# Patient Record
Sex: Female | Born: 1935 | Race: White | Hispanic: No | Smoking: Never smoker
Health system: Southern US, Community
[De-identification: ages and names within clinical notes are randomized; demographics above are authoritative.]

---

## 2001-11-28 ENCOUNTER — Encounter (HOSPITAL_BASED_OUTPATIENT_CLINIC_OR_DEPARTMENT_OTHER): Admission: RE | Admit: 2001-11-28 | Discharge: 2002-02-26 | Payer: Self-pay | Admitting: Internal Medicine

## 2001-12-03 ENCOUNTER — Ambulatory Visit (HOSPITAL_COMMUNITY): Admission: RE | Admit: 2001-12-03 | Discharge: 2001-12-03 | Payer: Self-pay | Admitting: Internal Medicine

## 2001-12-03 ENCOUNTER — Encounter (HOSPITAL_BASED_OUTPATIENT_CLINIC_OR_DEPARTMENT_OTHER): Payer: Self-pay | Admitting: Internal Medicine

## 2003-09-15 ENCOUNTER — Other Ambulatory Visit: Payer: Self-pay

## 2004-02-07 ENCOUNTER — Ambulatory Visit: Payer: Self-pay | Admitting: Internal Medicine

## 2004-03-09 ENCOUNTER — Ambulatory Visit: Payer: Self-pay | Admitting: Internal Medicine

## 2004-04-08 ENCOUNTER — Ambulatory Visit: Payer: Self-pay | Admitting: Internal Medicine

## 2004-05-09 ENCOUNTER — Ambulatory Visit: Payer: Self-pay | Admitting: Internal Medicine

## 2004-06-09 ENCOUNTER — Ambulatory Visit: Payer: Self-pay | Admitting: Internal Medicine

## 2004-07-07 ENCOUNTER — Ambulatory Visit: Payer: Self-pay | Admitting: Internal Medicine

## 2004-08-09 ENCOUNTER — Ambulatory Visit: Payer: Self-pay | Admitting: Internal Medicine

## 2004-08-30 ENCOUNTER — Ambulatory Visit: Payer: Self-pay | Admitting: Internal Medicine

## 2004-09-20 ENCOUNTER — Ambulatory Visit: Payer: Self-pay | Admitting: Internal Medicine

## 2004-09-30 ENCOUNTER — Ambulatory Visit: Payer: Self-pay | Admitting: Internal Medicine

## 2004-10-07 ENCOUNTER — Ambulatory Visit: Payer: Self-pay | Admitting: Internal Medicine

## 2004-10-14 ENCOUNTER — Ambulatory Visit: Payer: Self-pay | Admitting: Internal Medicine

## 2004-11-06 ENCOUNTER — Ambulatory Visit: Payer: Self-pay | Admitting: Internal Medicine

## 2004-12-07 ENCOUNTER — Ambulatory Visit: Payer: Self-pay | Admitting: Internal Medicine

## 2005-01-07 ENCOUNTER — Ambulatory Visit: Payer: Self-pay | Admitting: Internal Medicine

## 2005-02-02 ENCOUNTER — Ambulatory Visit: Payer: Self-pay | Admitting: Family Medicine

## 2005-02-06 ENCOUNTER — Ambulatory Visit: Payer: Self-pay | Admitting: Internal Medicine

## 2005-02-06 DIAGNOSIS — Z8679 Personal history of other diseases of the circulatory system: Secondary | ICD-10-CM | POA: Insufficient documentation

## 2005-02-17 ENCOUNTER — Ambulatory Visit: Payer: Self-pay

## 2005-02-28 ENCOUNTER — Encounter: Admission: RE | Admit: 2005-02-28 | Discharge: 2005-02-28 | Payer: Self-pay | Admitting: Neurology

## 2005-03-09 ENCOUNTER — Ambulatory Visit: Payer: Self-pay | Admitting: Internal Medicine

## 2005-03-21 ENCOUNTER — Ambulatory Visit: Payer: Self-pay | Admitting: Family Medicine

## 2005-04-18 ENCOUNTER — Ambulatory Visit: Payer: Self-pay | Admitting: Family Medicine

## 2005-04-19 ENCOUNTER — Ambulatory Visit: Payer: Self-pay | Admitting: Internal Medicine

## 2005-04-25 ENCOUNTER — Ambulatory Visit: Payer: Self-pay | Admitting: Internal Medicine

## 2005-05-09 ENCOUNTER — Ambulatory Visit: Payer: Self-pay | Admitting: Internal Medicine

## 2005-06-09 ENCOUNTER — Ambulatory Visit: Payer: Self-pay | Admitting: Internal Medicine

## 2005-06-20 ENCOUNTER — Ambulatory Visit: Payer: Self-pay | Admitting: Family Medicine

## 2005-06-30 ENCOUNTER — Emergency Department (HOSPITAL_COMMUNITY): Admission: EM | Admit: 2005-06-30 | Discharge: 2005-06-30 | Payer: Self-pay | Admitting: Emergency Medicine

## 2005-07-05 ENCOUNTER — Ambulatory Visit: Payer: Self-pay | Admitting: Family Medicine

## 2005-07-07 ENCOUNTER — Ambulatory Visit: Payer: Self-pay | Admitting: Internal Medicine

## 2005-07-25 ENCOUNTER — Ambulatory Visit: Payer: Self-pay | Admitting: Family Medicine

## 2005-08-01 ENCOUNTER — Ambulatory Visit: Payer: Self-pay | Admitting: Family Medicine

## 2005-08-07 ENCOUNTER — Ambulatory Visit: Payer: Self-pay | Admitting: Internal Medicine

## 2005-09-06 ENCOUNTER — Ambulatory Visit: Payer: Self-pay | Admitting: Internal Medicine

## 2005-10-07 ENCOUNTER — Ambulatory Visit: Payer: Self-pay | Admitting: Internal Medicine

## 2005-10-07 DIAGNOSIS — F329 Major depressive disorder, single episode, unspecified: Secondary | ICD-10-CM

## 2005-10-07 DIAGNOSIS — F411 Generalized anxiety disorder: Secondary | ICD-10-CM | POA: Insufficient documentation

## 2005-10-24 ENCOUNTER — Ambulatory Visit: Payer: Self-pay | Admitting: Family Medicine

## 2005-10-24 DIAGNOSIS — I10 Essential (primary) hypertension: Secondary | ICD-10-CM | POA: Insufficient documentation

## 2005-10-28 ENCOUNTER — Ambulatory Visit: Payer: Self-pay | Admitting: Family Medicine

## 2005-11-06 ENCOUNTER — Ambulatory Visit: Payer: Self-pay | Admitting: Internal Medicine

## 2005-11-21 ENCOUNTER — Ambulatory Visit: Payer: Self-pay | Admitting: Family Medicine

## 2005-11-29 ENCOUNTER — Ambulatory Visit: Payer: Self-pay | Admitting: Family Medicine

## 2005-12-12 ENCOUNTER — Ambulatory Visit: Payer: Self-pay | Admitting: Internal Medicine

## 2005-12-12 ENCOUNTER — Ambulatory Visit: Payer: Self-pay | Admitting: Family Medicine

## 2005-12-19 ENCOUNTER — Ambulatory Visit: Payer: Self-pay | Admitting: Family Medicine

## 2006-01-07 ENCOUNTER — Ambulatory Visit: Payer: Self-pay | Admitting: Internal Medicine

## 2006-01-11 ENCOUNTER — Ambulatory Visit: Payer: Self-pay | Admitting: Family Medicine

## 2006-02-06 ENCOUNTER — Ambulatory Visit: Payer: Self-pay | Admitting: Internal Medicine

## 2006-03-09 ENCOUNTER — Ambulatory Visit: Payer: Self-pay | Admitting: Internal Medicine

## 2006-04-08 ENCOUNTER — Ambulatory Visit: Payer: Self-pay | Admitting: Internal Medicine

## 2006-04-11 ENCOUNTER — Ambulatory Visit: Payer: Self-pay | Admitting: Family Medicine

## 2006-05-11 ENCOUNTER — Ambulatory Visit: Payer: Self-pay | Admitting: Internal Medicine

## 2006-05-30 ENCOUNTER — Ambulatory Visit: Payer: Self-pay | Admitting: Family Medicine

## 2006-06-09 ENCOUNTER — Ambulatory Visit: Payer: Self-pay | Admitting: Internal Medicine

## 2006-07-08 ENCOUNTER — Ambulatory Visit: Payer: Self-pay | Admitting: Internal Medicine

## 2006-08-08 ENCOUNTER — Ambulatory Visit: Payer: Self-pay | Admitting: Internal Medicine

## 2006-08-15 DIAGNOSIS — M810 Age-related osteoporosis without current pathological fracture: Secondary | ICD-10-CM | POA: Insufficient documentation

## 2006-08-15 DIAGNOSIS — E039 Hypothyroidism, unspecified: Secondary | ICD-10-CM | POA: Insufficient documentation

## 2006-09-07 ENCOUNTER — Ambulatory Visit: Payer: Self-pay | Admitting: Internal Medicine

## 2006-09-27 ENCOUNTER — Ambulatory Visit: Payer: Self-pay | Admitting: Family Medicine

## 2006-09-27 ENCOUNTER — Telehealth: Payer: Self-pay | Admitting: Family Medicine

## 2006-10-08 ENCOUNTER — Ambulatory Visit: Payer: Self-pay | Admitting: Internal Medicine

## 2006-10-10 ENCOUNTER — Ambulatory Visit: Payer: Self-pay | Admitting: Family Medicine

## 2006-10-10 DIAGNOSIS — E7800 Pure hypercholesterolemia, unspecified: Secondary | ICD-10-CM

## 2006-10-12 ENCOUNTER — Encounter (INDEPENDENT_AMBULATORY_CARE_PROVIDER_SITE_OTHER): Payer: Self-pay | Admitting: *Deleted

## 2006-10-12 ENCOUNTER — Ambulatory Visit: Payer: Self-pay | Admitting: Family Medicine

## 2006-10-12 DIAGNOSIS — D485 Neoplasm of uncertain behavior of skin: Secondary | ICD-10-CM

## 2006-10-12 LAB — CONVERTED CEMR LAB
ALT: 17 units/L (ref 0–40)
Albumin: 3.6 g/dL (ref 3.5–5.2)
Basophils Absolute: 0.4 10*3/uL — ABNORMAL HIGH (ref 0.0–0.1)
Basophils Relative: 2.8 % — ABNORMAL HIGH (ref 0.0–1.0)
Calcium: 9.2 mg/dL (ref 8.4–10.5)
Eosinophils Absolute: 1.2 10*3/uL — ABNORMAL HIGH (ref 0.0–0.6)
Eosinophils Relative: 10 % — ABNORMAL HIGH (ref 0.0–5.0)
Free T4: 0.9 ng/dL (ref 0.6–1.6)
Glucose, Bld: 88 mg/dL (ref 70–99)
HDL: 36.2 mg/dL — ABNORMAL LOW (ref >39.0)
Hemoglobin: 13.2 g/dL (ref 12.0–15.0)
Monocytes Absolute: 0.2 10*3/uL (ref 0.2–0.7)
Monocytes Relative: 1.7 % — ABNORMAL LOW (ref 3.0–11.0)
Neutrophils Relative %: 76 % (ref 43.0–77.0)
Platelets: 238 10*3/uL (ref 150–400)
RBC: 6.21 M/uL — ABNORMAL HIGH (ref 3.87–5.11)
RDW: 22.7 % — ABNORMAL HIGH (ref 11.5–14.6)
Sodium: 135 meq/L (ref 135–145)
TSH: 11.7 microintl units/mL — ABNORMAL HIGH (ref 0.35–5.50)
Total Bilirubin: 1 mg/dL (ref 0.3–1.2)
VLDL: 14 mg/dL (ref 0–40)

## 2006-10-19 ENCOUNTER — Ambulatory Visit: Payer: Self-pay | Admitting: Family Medicine

## 2006-10-19 DIAGNOSIS — D044 Carcinoma in situ of skin of scalp and neck: Secondary | ICD-10-CM

## 2006-10-26 ENCOUNTER — Ambulatory Visit: Payer: Self-pay | Admitting: Family Medicine

## 2006-10-27 ENCOUNTER — Encounter (INDEPENDENT_AMBULATORY_CARE_PROVIDER_SITE_OTHER): Payer: Self-pay | Admitting: *Deleted

## 2006-11-07 ENCOUNTER — Ambulatory Visit: Payer: Self-pay | Admitting: Internal Medicine

## 2006-11-16 ENCOUNTER — Ambulatory Visit: Payer: Self-pay | Admitting: Internal Medicine

## 2006-12-08 ENCOUNTER — Ambulatory Visit: Payer: Self-pay | Admitting: Internal Medicine

## 2007-01-08 ENCOUNTER — Ambulatory Visit: Payer: Self-pay | Admitting: Internal Medicine

## 2007-01-23 ENCOUNTER — Ambulatory Visit: Payer: Self-pay | Admitting: Family Medicine

## 2007-01-23 LAB — CONVERTED CEMR LAB
Free T4: 0.9 ng/dL (ref 0.6–1.6)
TSH: 6.93 microintl units/mL — ABNORMAL HIGH (ref 0.35–5.50)

## 2007-01-26 ENCOUNTER — Ambulatory Visit: Payer: Self-pay | Admitting: Family Medicine

## 2007-02-07 ENCOUNTER — Encounter: Payer: Self-pay | Admitting: Family Medicine

## 2007-02-07 ENCOUNTER — Ambulatory Visit: Payer: Self-pay | Admitting: Internal Medicine

## 2007-02-07 ENCOUNTER — Emergency Department (HOSPITAL_COMMUNITY): Admission: EM | Admit: 2007-02-07 | Discharge: 2007-02-08 | Payer: Self-pay | Admitting: Emergency Medicine

## 2007-03-10 ENCOUNTER — Ambulatory Visit: Payer: Self-pay | Admitting: Internal Medicine

## 2007-03-18 ENCOUNTER — Telehealth: Payer: Self-pay | Admitting: Internal Medicine

## 2007-04-09 ENCOUNTER — Ambulatory Visit: Payer: Self-pay | Admitting: Internal Medicine

## 2007-04-10 ENCOUNTER — Telehealth: Payer: Self-pay | Admitting: Family Medicine

## 2007-05-01 ENCOUNTER — Telehealth (INDEPENDENT_AMBULATORY_CARE_PROVIDER_SITE_OTHER): Payer: Self-pay | Admitting: *Deleted

## 2007-05-01 ENCOUNTER — Ambulatory Visit: Payer: Self-pay | Admitting: Family Medicine

## 2007-05-10 ENCOUNTER — Ambulatory Visit: Payer: Self-pay | Admitting: Internal Medicine

## 2007-06-10 ENCOUNTER — Ambulatory Visit: Payer: Self-pay | Admitting: Internal Medicine

## 2007-06-21 ENCOUNTER — Ambulatory Visit: Payer: Self-pay | Admitting: Family Medicine

## 2007-06-21 ENCOUNTER — Encounter: Payer: Self-pay | Admitting: Family Medicine

## 2007-06-26 ENCOUNTER — Encounter (INDEPENDENT_AMBULATORY_CARE_PROVIDER_SITE_OTHER): Payer: Self-pay | Admitting: *Deleted

## 2007-06-28 ENCOUNTER — Ambulatory Visit: Payer: Self-pay | Admitting: Internal Medicine

## 2007-07-08 ENCOUNTER — Ambulatory Visit: Payer: Self-pay | Admitting: Internal Medicine

## 2007-07-10 ENCOUNTER — Telehealth: Payer: Self-pay | Admitting: Family Medicine

## 2007-07-26 ENCOUNTER — Ambulatory Visit: Payer: Self-pay | Admitting: Family Medicine

## 2007-07-26 LAB — CONVERTED CEMR LAB
Free T4: 1.2 ng/dL (ref 0.6–1.6)
TSH: 3.46 microintl units/mL (ref 0.35–5.50)

## 2007-07-30 ENCOUNTER — Ambulatory Visit: Payer: Self-pay | Admitting: Family Medicine

## 2007-08-07 ENCOUNTER — Ambulatory Visit: Payer: Self-pay | Admitting: Internal Medicine

## 2007-08-08 ENCOUNTER — Ambulatory Visit: Payer: Self-pay | Admitting: Internal Medicine

## 2007-08-09 ENCOUNTER — Encounter (INDEPENDENT_AMBULATORY_CARE_PROVIDER_SITE_OTHER): Payer: Self-pay | Admitting: *Deleted

## 2007-09-07 ENCOUNTER — Ambulatory Visit: Payer: Self-pay | Admitting: Internal Medicine

## 2007-09-11 ENCOUNTER — Encounter: Payer: Self-pay | Admitting: Family Medicine

## 2007-10-08 ENCOUNTER — Ambulatory Visit: Payer: Self-pay | Admitting: Internal Medicine

## 2007-11-07 ENCOUNTER — Ambulatory Visit: Payer: Self-pay | Admitting: Internal Medicine

## 2007-11-24 ENCOUNTER — Observation Stay (HOSPITAL_COMMUNITY): Admission: EM | Admit: 2007-11-24 | Discharge: 2007-11-25 | Payer: Self-pay | Admitting: Emergency Medicine

## 2007-11-24 ENCOUNTER — Ambulatory Visit: Payer: Self-pay | Admitting: Internal Medicine

## 2007-11-24 ENCOUNTER — Telehealth: Payer: Self-pay | Admitting: Family Medicine

## 2007-11-25 ENCOUNTER — Encounter: Payer: Self-pay | Admitting: Family Medicine

## 2007-11-27 ENCOUNTER — Encounter: Payer: Self-pay | Admitting: Family Medicine

## 2007-12-04 ENCOUNTER — Ambulatory Visit: Payer: Self-pay | Admitting: Family Medicine

## 2007-12-08 ENCOUNTER — Ambulatory Visit: Payer: Self-pay | Admitting: Internal Medicine

## 2008-01-08 ENCOUNTER — Ambulatory Visit: Payer: Self-pay | Admitting: Internal Medicine

## 2008-01-24 ENCOUNTER — Ambulatory Visit: Payer: Self-pay | Admitting: Family Medicine

## 2008-01-24 LAB — CONVERTED CEMR LAB
AST: 24 units/L (ref 0–37)
Alkaline Phosphatase: 59 units/L (ref 39–117)
BUN: 21 mg/dL (ref 6–23)
Basophils Absolute: 0 10*3/uL (ref 0.0–0.1)
Bilirubin, Direct: 0.2 mg/dL (ref 0.0–0.3)
CO2: 26 meq/L (ref 19–32)
Calcium: 9.4 mg/dL (ref 8.4–10.5)
Cholesterol: 96 mg/dL (ref 0–200)
Free T4: 0.9 ng/dL (ref 0.6–1.6)
GFR calc Af Amer: 37 mL/min
GFR calc non Af Amer: 31 mL/min
HCT: 40.6 % (ref 36.0–46.0)
HDL: 32.5 mg/dL — ABNORMAL LOW (ref >39.0)
Hemoglobin: 12.8 g/dL (ref 12.0–15.0)
LDL Cholesterol: 48 mg/dL (ref 0–99)
Lymphocytes Relative: 14.1 % (ref 12.0–46.0)
MCHC: 31.5 g/dL (ref 30.0–36.0)
Monocytes Absolute: 0.6 10*3/uL (ref 0.1–1.0)
Potassium: 4.1 meq/L (ref 3.5–5.1)
Sodium: 136 meq/L (ref 135–145)
Total Bilirubin: 1.1 mg/dL (ref 0.3–1.2)
Total Protein: 7.3 g/dL (ref 6.0–8.3)
VLDL: 15 mg/dL (ref 0–40)
WBC: 9.3 10*3/uL (ref 4.5–10.5)

## 2008-01-29 ENCOUNTER — Ambulatory Visit: Payer: Self-pay | Admitting: Family Medicine

## 2008-02-07 ENCOUNTER — Ambulatory Visit: Payer: Self-pay | Admitting: Internal Medicine

## 2008-02-15 ENCOUNTER — Telehealth: Payer: Self-pay | Admitting: Family Medicine

## 2008-02-19 ENCOUNTER — Telehealth: Payer: Self-pay | Admitting: Family Medicine

## 2008-03-04 ENCOUNTER — Ambulatory Visit: Payer: Self-pay | Admitting: Family Medicine

## 2008-03-04 LAB — CONVERTED CEMR LAB
BUN: 25 mg/dL — ABNORMAL HIGH (ref 6–23)
Basophils Absolute: 0 10*3/uL (ref 0.0–0.1)
Basophils Relative: 0.1 % (ref 0.0–3.0)
Calcium: 9.1 mg/dL (ref 8.4–10.5)
Creatinine, Ser: 2.9 mg/dL — ABNORMAL HIGH (ref 0.4–1.2)
Eosinophils Relative: 8.9 % — ABNORMAL HIGH (ref 0.0–5.0)
GFR calc Af Amer: 20 mL/min
Iron: 15 ug/dL — ABNORMAL LOW (ref 42–145)
Lymphocytes Relative: 19.7 % (ref 12.0–46.0)
MCHC: 31 g/dL (ref 30.0–36.0)
Monocytes Relative: 4.9 % (ref 3.0–12.0)
Neutrophils Relative %: 66.4 % (ref 43.0–77.0)
Platelets: 178 10*3/uL (ref 150–400)
RDW: 24.8 % — ABNORMAL HIGH (ref 11.5–14.6)

## 2008-03-09 ENCOUNTER — Ambulatory Visit: Payer: Self-pay | Admitting: Internal Medicine

## 2008-03-13 ENCOUNTER — Ambulatory Visit: Payer: Self-pay | Admitting: Family Medicine

## 2008-03-13 DIAGNOSIS — J984 Other disorders of lung: Secondary | ICD-10-CM

## 2008-03-18 ENCOUNTER — Ambulatory Visit: Payer: Self-pay | Admitting: Internal Medicine

## 2008-03-19 ENCOUNTER — Ambulatory Visit: Payer: Self-pay | Admitting: Family Medicine

## 2008-04-02 ENCOUNTER — Encounter: Payer: Self-pay | Admitting: Family Medicine

## 2008-04-02 ENCOUNTER — Telehealth: Payer: Self-pay | Admitting: Family Medicine

## 2008-04-04 ENCOUNTER — Observation Stay: Payer: Self-pay | Admitting: Nephrology

## 2008-04-08 ENCOUNTER — Ambulatory Visit: Payer: Self-pay | Admitting: Internal Medicine

## 2008-05-09 ENCOUNTER — Ambulatory Visit: Payer: Self-pay | Admitting: Internal Medicine

## 2008-05-26 ENCOUNTER — Encounter: Payer: Self-pay | Admitting: Family Medicine

## 2008-06-02 ENCOUNTER — Telehealth: Payer: Self-pay | Admitting: Family Medicine

## 2008-06-02 ENCOUNTER — Encounter: Payer: Self-pay | Admitting: Family Medicine

## 2008-06-10 ENCOUNTER — Ambulatory Visit: Payer: Self-pay | Admitting: Internal Medicine

## 2008-06-12 ENCOUNTER — Ambulatory Visit: Payer: Self-pay | Admitting: Family Medicine

## 2008-06-16 LAB — CONVERTED CEMR LAB
Albumin: 3.3 g/dL — ABNORMAL LOW (ref 3.5–5.2)
Basophils Absolute: 0 10*3/uL (ref 0.0–0.1)
CO2: 0 meq/L — CL (ref 19–32)
Eosinophils Absolute: 0.3 10*3/uL (ref 0.0–0.7)
Eosinophils Relative: 2.1 % (ref 0.0–5.0)
Glucose, Bld: 101 mg/dL — ABNORMAL HIGH (ref 70–99)
HCT: 40.7 % (ref 36.0–46.0)
Hemoglobin: 12.5 g/dL (ref 12.0–15.0)
Neutro Abs: 11.8 10*3/uL — ABNORMAL HIGH (ref 1.4–7.7)
Platelets: 157 10*3/uL (ref 150–400)
RDW: 21.9 % — ABNORMAL HIGH (ref 11.5–14.6)
Sodium: 137 meq/L (ref 135–145)

## 2008-06-23 ENCOUNTER — Ambulatory Visit: Payer: Self-pay | Admitting: Family Medicine

## 2008-06-24 ENCOUNTER — Ambulatory Visit: Payer: Self-pay | Admitting: Family Medicine

## 2008-06-24 ENCOUNTER — Encounter: Payer: Self-pay | Admitting: Family Medicine

## 2008-06-26 ENCOUNTER — Encounter (INDEPENDENT_AMBULATORY_CARE_PROVIDER_SITE_OTHER): Payer: Self-pay | Admitting: *Deleted

## 2008-06-30 ENCOUNTER — Ambulatory Visit: Payer: Self-pay | Admitting: Family Medicine

## 2008-07-07 ENCOUNTER — Ambulatory Visit: Payer: Self-pay | Admitting: Internal Medicine

## 2008-07-07 ENCOUNTER — Encounter: Payer: Self-pay | Admitting: Family Medicine

## 2008-07-14 ENCOUNTER — Ambulatory Visit: Payer: Self-pay | Admitting: Family Medicine

## 2008-08-04 ENCOUNTER — Encounter: Payer: Self-pay | Admitting: Family Medicine

## 2008-08-07 ENCOUNTER — Ambulatory Visit: Payer: Self-pay | Admitting: Internal Medicine

## 2008-09-06 ENCOUNTER — Ambulatory Visit: Payer: Self-pay | Admitting: Internal Medicine

## 2008-09-09 ENCOUNTER — Ambulatory Visit: Payer: Self-pay | Admitting: Internal Medicine

## 2008-09-29 ENCOUNTER — Encounter: Payer: Self-pay | Admitting: Family Medicine

## 2008-10-07 ENCOUNTER — Ambulatory Visit: Payer: Self-pay | Admitting: Internal Medicine

## 2008-10-21 ENCOUNTER — Ambulatory Visit: Payer: Self-pay | Admitting: Family Medicine

## 2008-10-21 LAB — CONVERTED CEMR LAB
AST: 26 units/L (ref 0–37)
Albumin: 3.6 g/dL (ref 3.5–5.2)
BUN: 20 mg/dL (ref 6–23)
Basophils Relative: 0.2 % (ref 0.0–3.0)
Bilirubin, Direct: 0.1 mg/dL (ref 0.0–0.3)
CO2: 28 meq/L (ref 19–32)
Chloride: 105 meq/L (ref 96–112)
Cholesterol: 110 mg/dL (ref 0–200)
HCT: 44.3 % (ref 36.0–46.0)
HDL: 29 mg/dL — ABNORMAL LOW (ref >39.00)
Hemoglobin: 13.4 g/dL (ref 12.0–15.0)
Lymphs Abs: 1.6 10*3/uL (ref 0.7–4.0)
MCHC: 30.2 g/dL (ref 30.0–36.0)
Neutro Abs: 6 10*3/uL (ref 1.4–7.7)
Potassium: 4 meq/L (ref 3.5–5.1)
RBC: 5.98 M/uL — ABNORMAL HIGH (ref 3.87–5.11)
Sodium: 139 meq/L (ref 135–145)
TSH: 2.58 microintl units/mL (ref 0.35–5.50)
Total CHOL/HDL Ratio: 4
Total Protein: 7.2 g/dL (ref 6.0–8.3)
Triglycerides: 88 mg/dL (ref 0.0–149.0)

## 2008-10-23 LAB — CONVERTED CEMR LAB: Vit D, 25-Hydroxy: 34 ng/mL (ref 30–89)

## 2008-10-27 ENCOUNTER — Telehealth: Payer: Self-pay | Admitting: Family Medicine

## 2008-10-29 ENCOUNTER — Ambulatory Visit: Payer: Self-pay | Admitting: Family Medicine

## 2008-11-06 ENCOUNTER — Ambulatory Visit: Payer: Self-pay | Admitting: Internal Medicine

## 2008-11-18 ENCOUNTER — Encounter: Payer: Self-pay | Admitting: Family Medicine

## 2008-12-07 ENCOUNTER — Ambulatory Visit: Payer: Self-pay | Admitting: Internal Medicine

## 2008-12-22 ENCOUNTER — Encounter: Payer: Self-pay | Admitting: Family Medicine

## 2009-01-07 ENCOUNTER — Ambulatory Visit: Payer: Self-pay | Admitting: Internal Medicine

## 2009-02-06 ENCOUNTER — Ambulatory Visit: Payer: Self-pay | Admitting: Internal Medicine

## 2009-03-09 ENCOUNTER — Ambulatory Visit: Payer: Self-pay | Admitting: Internal Medicine

## 2009-03-16 ENCOUNTER — Telehealth: Payer: Self-pay | Admitting: Family Medicine

## 2009-03-16 ENCOUNTER — Encounter: Payer: Self-pay | Admitting: Family Medicine

## 2009-04-08 ENCOUNTER — Ambulatory Visit: Payer: Self-pay | Admitting: Internal Medicine

## 2009-04-21 ENCOUNTER — Ambulatory Visit: Payer: Self-pay | Admitting: Internal Medicine

## 2009-05-09 ENCOUNTER — Ambulatory Visit: Payer: Self-pay | Admitting: Internal Medicine

## 2009-05-12 ENCOUNTER — Ambulatory Visit: Payer: Self-pay | Admitting: Family Medicine

## 2009-06-01 ENCOUNTER — Telehealth (INDEPENDENT_AMBULATORY_CARE_PROVIDER_SITE_OTHER): Payer: Self-pay | Admitting: *Deleted

## 2009-06-08 ENCOUNTER — Encounter: Payer: Self-pay | Admitting: Family Medicine

## 2009-06-09 ENCOUNTER — Ambulatory Visit: Payer: Self-pay | Admitting: Internal Medicine

## 2009-07-07 ENCOUNTER — Encounter: Payer: Self-pay | Admitting: Family Medicine

## 2009-07-07 ENCOUNTER — Ambulatory Visit: Payer: Self-pay | Admitting: Family Medicine

## 2009-07-08 ENCOUNTER — Ambulatory Visit: Payer: Self-pay | Admitting: Internal Medicine

## 2009-07-13 ENCOUNTER — Encounter (INDEPENDENT_AMBULATORY_CARE_PROVIDER_SITE_OTHER): Payer: Self-pay | Admitting: *Deleted

## 2009-08-07 ENCOUNTER — Ambulatory Visit: Payer: Self-pay | Admitting: Internal Medicine

## 2009-08-31 ENCOUNTER — Telehealth: Payer: Self-pay | Admitting: Family Medicine

## 2009-09-01 ENCOUNTER — Encounter: Payer: Self-pay | Admitting: Family Medicine

## 2009-09-06 ENCOUNTER — Ambulatory Visit: Payer: Self-pay | Admitting: Internal Medicine

## 2009-10-07 ENCOUNTER — Ambulatory Visit: Payer: Self-pay | Admitting: Internal Medicine

## 2009-10-26 ENCOUNTER — Telehealth: Payer: Self-pay | Admitting: Family Medicine

## 2009-11-06 ENCOUNTER — Ambulatory Visit: Payer: Self-pay | Admitting: Internal Medicine

## 2009-11-13 ENCOUNTER — Ambulatory Visit: Payer: Self-pay | Admitting: Family Medicine

## 2009-11-24 ENCOUNTER — Encounter: Payer: Self-pay | Admitting: Family Medicine

## 2009-12-07 ENCOUNTER — Ambulatory Visit: Payer: Self-pay | Admitting: Internal Medicine

## 2009-12-09 ENCOUNTER — Encounter (INDEPENDENT_AMBULATORY_CARE_PROVIDER_SITE_OTHER): Payer: Self-pay | Admitting: *Deleted

## 2010-01-07 ENCOUNTER — Ambulatory Visit: Payer: Self-pay | Admitting: Internal Medicine

## 2010-02-06 ENCOUNTER — Ambulatory Visit: Payer: Self-pay | Admitting: Internal Medicine

## 2010-03-09 ENCOUNTER — Ambulatory Visit: Payer: Self-pay | Admitting: Internal Medicine

## 2010-03-19 ENCOUNTER — Encounter: Payer: Self-pay | Admitting: Family Medicine

## 2010-04-08 ENCOUNTER — Ambulatory Visit: Payer: Self-pay | Admitting: Internal Medicine

## 2010-05-09 ENCOUNTER — Ambulatory Visit: Payer: Self-pay | Admitting: Internal Medicine

## 2010-05-11 ENCOUNTER — Telehealth: Payer: Self-pay | Admitting: Family Medicine

## 2010-05-19 ENCOUNTER — Ambulatory Visit: Admit: 2010-05-19 | Payer: Self-pay | Admitting: Family Medicine

## 2010-05-27 ENCOUNTER — Other Ambulatory Visit: Payer: Self-pay | Admitting: Family Medicine

## 2010-05-27 ENCOUNTER — Ambulatory Visit
Admission: RE | Admit: 2010-05-27 | Discharge: 2010-05-27 | Payer: Self-pay | Source: Home / Self Care | Attending: Family Medicine | Admitting: Family Medicine

## 2010-05-28 LAB — BASIC METABOLIC PANEL
BUN: 29 mg/dL — ABNORMAL HIGH (ref 6–23)
CO2: 26 mEq/L (ref 19–32)
Calcium: 8.9 mg/dL (ref 8.4–10.5)
Chloride: 99 mEq/L (ref 96–112)
Creatinine, Ser: 1.4 mg/dL — ABNORMAL HIGH (ref 0.4–1.2)
GFR: 39.19 mL/min — ABNORMAL LOW (ref >60.00)
Glucose, Bld: 85 mg/dL (ref 70–99)
Potassium: 4.7 mEq/L (ref 3.5–5.1)
Sodium: 134 mEq/L — ABNORMAL LOW (ref 135–145)

## 2010-05-28 LAB — LDL CHOLESTEROL, DIRECT: Direct LDL: 66.9 mg/dL

## 2010-05-28 LAB — LIPID PANEL
Cholesterol: 107 mg/dL (ref 0–200)
HDL: 26.8 mg/dL — ABNORMAL LOW (ref >39.00)
Total CHOL/HDL Ratio: 4
Triglycerides: 205 mg/dL — ABNORMAL HIGH (ref 0.0–149.0)
VLDL: 41 mg/dL — ABNORMAL HIGH (ref 0.0–40.0)

## 2010-05-28 LAB — TSH: TSH: 1.11 u[IU]/mL (ref 0.35–5.50)

## 2010-06-06 LAB — CONVERTED CEMR LAB
BUN: 16 mg/dL (ref 6–23)
CO2: 23 meq/L (ref 19–32)
Calcium: 8.9 mg/dL (ref 8.4–10.5)
GFR calc non Af Amer: 46 mL/min
Potassium: 4.7 meq/L (ref 3.5–5.1)

## 2010-06-09 ENCOUNTER — Ambulatory Visit: Payer: Self-pay | Admitting: Internal Medicine

## 2010-06-10 NOTE — Letter (Signed)
Summary: [facility] / F/U CHRONIC KIDNEY DISEASE STATE  [facility] / F/U CHRONIC KIDNEY DISEASE STATE III / DR. Rexene Edison LATEEF   Imported By: Carin Primrose 09/01/2009 14:40:41  _____________________________________________________________________  External Attachment:    Type:   Image     Comment:   External Document

## 2010-06-10 NOTE — Progress Notes (Signed)
Summary: Rx Sertraline  Phone Note Refill Request Call back at [phone] Message [facility] on August 31, 2009 9:31 AM  Refills Requested: Medication #1:  SERTRALINE HCL 25 MG TABS 1 1/2 once daily/ by mouth   Last Refilled: 05/31/2009 Received faxed refill request please advise.   Method Requested: Telephone to Pharmacy Initial call taken by: Linde Gillis CMA Duncan Dull),  August 31, 2009 9:32 AM    Prescriptions: SERTRALINE HCL 25 MG TABS (SERTRALINE HCL) 1 1/2 once daily/ by mouth  #135 x 4   Entered and Authorized by:   Shaune Leeks MD   Signed by:   Shaune Leeks MD on 08/31/2009   Method used:   [facility]ronically to        [facility] [phone]* (retail)       [address] [place], [place]  [address]       Ph: [phone] or [phone]       Fax: [phone]   RxID:   [id number]

## 2010-06-10 NOTE — Letter (Signed)
Summary: Donna [name] letter  [place] at [facility]  [address] [place], [place] [address]   Phone: [phone]  Fax: [phone]       12/09/2009 MRN: [id number]  Donna [name] [address] [place], [place]  [address]  Dear Ms. Donna [name] [place], and [facility] announce the retirement of Donna [name], M.D., from full-time practice at the [facility] office effective November 05, 2009 and his plans of returning part-time.  It is important to Dr. Hetty [name] and to our practice that you understand that [facility] has seven physicians in our office for your health care needs.  We will continue to offer the same exceptional care that you have today.    Dr. Hetty [name] has spoken to many of you about his plans for retirement and returning part-time in the fall.   We will continue to work with you through the transition to schedule appointments for you in the office and meet the high standards that [facility] is committed to.   Again, it is with great pleasure that we share the news that Dr. Hetty [name] will return to [facility] at [facility] in October of 2011 with a reduced schedule.    If you have any questions, or would like to request an appointment with one of our physicians, please call us at [phone] and press the option for Scheduling an appointment.  We take pleasure in providing you with excellent patient care and look forward to seeing you at your next office visit.  Our [facility] Physicians are:  Tillman Abide, M.D. Laurita Quint, M.D. Roxy Manns, M.D. Kerby Nora, M.D. Hannah Beat, M.D. Ruthe Mannan, M.D. We proudly welcomed Raechel Ache, M.D. and Eustaquio Boyden, M.D. to the practice in July/August 2011.  Sincerely,  [facility]

## 2010-06-10 NOTE — Assessment & Plan Note (Signed)
Summary: 6 MONTH FOLLOW UP/RBH   Vital Signs:  Patient profile:   77 year old female Weight:      149.75 pounds Temp:     98.3 degrees F oral Pulse rate:   80 / minute Pulse rhythm:   regular BP sitting:   110 / 60  (left arm) Cuff size:   regular  Vitals Entered By: Linde Gillis CMA Duncan Dull) (May 12, 2009 2:51 PM) CC: 6 month follow up   History of Present Illness: Pt her with friend. Had phlebotomy the last wo visits with Dr Lorre Nick, the last was in Nov. She saw Dr Cherylann Ratel recently and things are stable. She feels well with no complaints. Dr Lorre Nick felt it best not to get the flu shot.  Problems Prior to Update: 1)  Unspc Extrapyrmdl Dz&abnml Movmnt D/o S/p Stroke (DR LOVE)  (ICD-333.90) 2)  Chr Kid Dz Stage Iii (MOD) Interstit Nephritis (DR LATEEF)  (ICD-585.3) 3)  Pulmonary Nodule, Right Upper Lobe  (ICD-518.89) 4)  Other Screening Mammogram  (ICD-V76.12) 5)  Screening For Malignannt Neoplasm, Site Nec  (ICD-V76.49) 6)  Carcinoma, Basal Cell, Neck  (ICD-232.4) 7)  Neoplasm, Skin, Uncertain Behavior  (ICD-238.2) 8)  Hypercholesterolemia, Pure  (ICD-272.0) 9)  Hypertension, Benign Essential  (ICD-401.1) 10)  Dysphasia Via Swallowing Study  (ICD-784.5) 11)  Anxiety  (ICD-300.00) 12)  Depression  (ICD-311) 13)  Transient Ischemic Attacks, Hx of  (ICD-V12.50) 14)  Anemia, Iron Deficiency Nec  (ICD-280.8) 15)  Osteoporosis  (ICD-733.00) 16)  Hypothyroidism  (ICD-244.9) 17)  Eosinophilia, Chronic and Slowly Progressive  (ICD-288.3) 18)  Polycythemia Rubra Vera (DR Neale Burly)  (ICD-238.4)  Medications Prior to Update: 1)  Levoxyl 100 Mcg Tabs (Levothyroxine Sodium) .... One Tab By Mouth Daily 2)  Aggrenox 25-200 Mg Cp12 (Aspirin-Dipyridamole) .Marland Kitchen.. 1 Twice A Day By Mouth 3)  Hydrea 500 Mg Caps (Hydroxyurea) .Marland Kitchen.. 1 Per Week Tuesday Am By Mouth 4)  Sertraline Hcl 25 Mg Tabs (Sertraline Hcl) .Marland Kitchen.. 1 1/2 Once Daily/ By Mouth 5)  Haloperidol 0.5 Mg Tabs (Haloperidol) .Marland Kitchen.. 1 Twice A  Day By Mouth   1 Early Am and Dinner 6)  Metoprolol Tartrate 25 Mg  Tabs (Metoprolol Tartrate) .Marland Kitchen.. 11/2 Tabs By Mouth Two Times A Day. 7)  Juice Plus Fibre  Liqd (Nutritional Supplements) .... 2 Once Daily With 8 Ounces of Water 8)  Juice Plus Fibre  Liqd (Nutritional Supplements) .... Vineyard Blend 2 At Am 9)  Juice Plus Fibre  Liqd (Nutritional Supplements) .... Veggies 2 At Dinner 10)  Sm Calcium 600 +d 600-125 Mg-Unit Tabs (Calcium Carbonate-Vitamin D) .Marland KitchenMarland KitchenMarland Kitchen 1twice A Day By Mouth 11)  Fish Oil 1000 Mg Caps (Omega-3 Fatty Acids) .Marland Kitchen.. 1 Twice A Day By Mouth 12)  Amlodipine Besylate 10 Mg Tabs (Amlodipine Besylate) .Marland Kitchen.. 1 At Lunch By Mouth 13)  Liqui-Dualcitra 500-334 Mg/51ml Soln (Sod Citrate-Citric Acid) .... 30 Ml Twice A Day 14)  Prednisone 10 Mg Tabs (Prednisone) .... 30 Mg Daily 15)  Zantac 150 Mg Tabs (Ranitidine Hcl) .Marland Kitchen.. 1 Early Am By Mouth 16)  Bactroban 2 % Crea (Mupirocin Calcium) .... Apply To Area Two Times A Day  Allergies: 1)  ! Sulfa 2)  ! Penicillin 3)  ! Pneumovax 23 (Pneumococcal Vac Polyvalent) 4)  ! * ?aggrenox 5)  ! Ramipril (Ramipril)  Physical Exam  General:  Well-developed,well-nourished,in no acute distress; alert,appropriate and cooperative throughout examination Head:  Normocephalic and atraumatic without obvious abnormalities. No apparent alopecia or balding. Eyes:  Conjunctiva clear bilaterally.  Ears:  External ear exam shows no significant lesions or deformities.  Otoscopic examination reveals clear canals, tympanic membranes are intact bilaterally without bulging, retraction, inflammation or discharge. Hearing is grossly normal bilaterally. Nose:  External nasal examination shows no deformity or inflammation. Nasal mucosa are pink and moist without lesions or exudates. Mouth:  Oral mucosa and oropharynx without lesions or exudates.  Teeth in good repair.  Neck:  No deformities, masses, or tenderness noted. Lungs:  Normal respiratory effort, chest  expands symmetrically. Lungs are clear to auscultation, no crackles or wheezes. Heart:  Normal rate and regular rhythm. S1 and S2 normal without gallop, murmur, click, rub or other extra sounds. Abdomen:  Bowel sounds positive,abdomen soft and non-tender without masses, organomegaly or hernias noted. No suprapubic tenderness. Pulses:  R and L carotid,radial,femoral,dorsalis pedis and posterior tibial pulses are full and equal bilaterally Skin:  5mm irritated poorly delineated lesion on the anterior middle surface of the left pinna. Pt  unaware.   Impression & Recommendations:  Problem # 1:  CHR KID DZ STAGE III (MOD) INTERSTIT  NEPHRITIS (DR LATEEF) (ICD-585.3) Assessment Unchanged Stable per Dr Cherylann Ratel.  Problem # 2:  CARCINOMA, BASAL CELL, NECK (ICD-232.4) Assessment: Unchanged Has spot on left ant pinna which is irritated. leave alone for one month. If not healed, see Dermatologist.  Problem # 3:  HYPERTENSION, BENIGN ESSENTIAL (ICD-401.1) Assessment: Improved Excellent BP today. Her updated medication list for this problem includes:    Metoprolol Tartrate 25 Mg Tabs (Metoprolol tartrate) .Marland Kitchen... 11/2 tabs by mouth two times a day.    Amlodipine Besylate 10 Mg Tabs (Amlodipine besylate) .Marland Kitchen... 1 at lunch by mouth  BP today: 110/60 Prior BP: 142/62 (10/29/2008)  Labs Reviewed: K+: 4.0 (10/21/2008) Creat: : 1.5 (10/21/2008)   Chol: 110 (10/21/2008)   HDL: 29.00 (10/21/2008)   LDL: 63 (10/21/2008)   TG: 88.0 (10/21/2008)  Problem # 4:  ANXIETY (ICD-300.00) Assessment: Unchanged Well controlled. Her updated medication list for this problem includes:    Sertraline Hcl 25 Mg Tabs (Sertraline hcl) .Marland Kitchen... 1 1/2 once daily/ by mouth  Problem # 5:  POLYCYTHEMIA RUBRA VERA (DR Neale Burly) (ICD-238.4) Assessment: Unchanged Controlled via blood draws, last early Nov.  Complete Medication List: 1)  Levoxyl 100 Mcg Tabs (Levothyroxine sodium) .... One tab by mouth daily 2)  Aggrenox 25-200 Mg  Cp12 (Aspirin-dipyridamole) .Marland Kitchen.. 1 twice a day by mouth 3)  Hydrea 500 Mg Caps (Hydroxyurea) .Marland Kitchen.. 1 per week tuesday am by mouth 4)  Sertraline Hcl 25 Mg Tabs (Sertraline hcl) .Marland Kitchen.. 1 1/2 once daily/ by mouth 5)  Haloperidol 0.5 Mg Tabs (Haloperidol) .Marland Kitchen.. 1 twice a day by mouth   1 early am and dinner 6)  Metoprolol Tartrate 25 Mg Tabs (Metoprolol tartrate) .Marland Kitchen.. 11/2 tabs by mouth two times a day. 7)  Juice Plus Fibre Liqd (Nutritional supplements) .... 2 once daily with 8 ounces of water 8)  Juice Plus Fibre Liqd (Nutritional supplements) .... Vineyard blend 2 at am 9)  Juice Plus Fibre Liqd (Nutritional supplements) .... Veggies 2 at dinner 10)  Sm Calcium 600 +d 600-125 Mg-unit Tabs (Calcium carbonate-vitamin d) .Marland KitchenMarland KitchenMarland Kitchen 1twice a day by mouth 11)  Fish Oil 1000 Mg Caps (Omega-3 fatty acids) .Marland Kitchen.. 1 twice a day by mouth 12)  Amlodipine Besylate 10 Mg Tabs (Amlodipine besylate) .Marland Kitchen.. 1 at lunch by mouth 13)  Liqui-dualcitra 500-334 Mg/70ml Soln (Sod citrate-citric acid) .... 30 ml twice a day 14)  Zantac 150 Mg Tabs (Ranitidine hcl) .Marland Kitchen.. 1 early  am by mouth  Patient Instructions: 1)  RTC with Dr Dayton Martes for appt in Jul for assumption of care.   Current Allergies (reviewed today): ! SULFA ! PENICILLIN ! PNEUMOVAX 23 (PNEUMOCOCCAL VAC POLYVALENT) ! * ?AGGRENOX ! RAMIPRIL (RAMIPRIL)

## 2010-06-10 NOTE — Letter (Signed)
Summary: Results Follow up Letter  [place] at [facility]  [address] [place], [place] [address]   Phone: [phone]  Fax: [phone]    07/13/2009 MRN: [id number]  [place] [place] [address] [place], [place]  [address]  Dear Ms. Caryn Section,  The following are the results of your recent test(s):  Test         Result    Pap Smear:        Normal _____  Not Normal _____ Comments: ______________________________________________________ Cholesterol: LDL(Bad cholesterol):         Your goal is less than:         HDL (Good cholesterol):       Your goal is more than: Comments:  ______________________________________________________ Mammogram:        Normal _X____  Not Normal _____ Comments:Please repeat in one year.  ___________________________________________________________________ Hemoccult:        Normal _____  Not normal _______ Comments:    _____________________________________________________________________ Other Tests:    We routinely do not discuss normal results over the telephone.  If you desire a copy of the results, or you have any questions about this information we can discuss them at your next office visit.   Sincerely,     Laurita Quint, MD

## 2010-06-10 NOTE — Consult Note (Signed)
Summary: Dr.Munsoor Lateef,F/U Chronic Kidney Disease,Note  Dr.Munsoor Lateef,F/U Chronic Kidney Disease,Note   Imported By: Beau Fanny 06/09/2009 15:03:12  _____________________________________________________________________  External Attachment:    Type:   Image     Comment:   External Document

## 2010-06-10 NOTE — Consult Note (Signed)
Summary: [facility]   Imported By: Lanelle Bal 05/19/2010 12:06:22  _____________________________________________________________________  External Attachment:    Type:   Image     Comment:   External Document

## 2010-06-10 NOTE — Progress Notes (Signed)
Summary: Need order for Mammogram   Phone Note Call from Patient   Summary of Call: Pt recd ltr request order for her yearly mammogram w/ [facility].Marland KitchenMarland KitchenPt call back # [phone].Marland KitchenDaine Gip  June 01, 2009 2:17 PM   Initial call taken by: Daine Gip,  June 01, 2009 2:17 PM  Follow-up for Phone Call        Mammogram scheduled at [facility].Marland KitchenMarland KitchenPt aware of appt.Daine Gip  June 09, 2009 11:04 AM Follow-up by: Daine Gip,  June 09, 2009 11:04 AM

## 2010-06-10 NOTE — Letter (Signed)
Summary: [facility]   Imported By: Lanelle Bal 11/30/2009 12:01:11  _____________________________________________________________________  External Attachment:    Type:   Image     Comment:   External Document

## 2010-06-10 NOTE — Progress Notes (Signed)
Summary: Rx Haloperidol  Phone Note Refill Request Call back at [phone] Message from:  [facility] [address] on May 11, 2010 9:00 AM  Refills Requested: Medication #1:  HALOPERIDOL 0.5 MG TABS 1 TWICE A DAY BY MOUTH   1 EARLY AM AND DINNER   Last Refilled: 12/06/2009 Initial call taken by: Sydell Axon LPN,  May 11, 2010 9:00 AM  Follow-up for Phone Call        Rx called to pharmacy Follow-up by: Linde Gillis CMA Duncan Dull),  May 11, 2010 11:17 AM    Prescriptions: HALOPERIDOL 0.5 MG TABS (HALOPERIDOL) 1 TWICE A DAY BY MOUTH   1 EARLY AM AND DINNER  #60 x 12   Entered and Authorized by:   Ruthe Mannan MD   Signed by:   Ruthe Mannan MD on 05/11/2010   Method used:   Telephoned to ...       [facility] [phone]* (retail)       [address] [place], [place]  [address]       Ph: [phone] or [phone]       Fax: [phone]   RxID:   [id number]

## 2010-06-10 NOTE — Assessment & Plan Note (Signed)
Summary: 6 Month follow-up/rl   Vital Signs:  Patient profile:   77 year old female Height:      64 inches Weight:      149.50 pounds BMI:     25.75 Temp:     98.7 degrees F oral Pulse rate:   60 / minute Pulse rhythm:   regular BP sitting:   130 / 70  (left arm) Cuff size:   regular  Vitals Entered By: Linde Gillis CMA Duncan Dull) (May 27, 2010 3:35 PM) CC: 6 month follow up   History of Present Illness: 75 yo here for follow up. Pt here with friend.  HTN- doing well.  No side effects on current meds.  No HA, blurred vision, LE edema, CP or SOB. Pulse is a little low today and she is on Metoprolol but per pt she is asymptomatic.  Chart reviewed and pulse is often in 60s.    Stage III CKD- followed by  Dr Cherylann Ratel recently, next appointment scheduled for 06/11/2010.  She feels well with no complaints.  HLD- last lipid panel was stable, taking Fish oil.    Hypothyroidism- On levothyroxine 100 micrograms daily.  Denies any symptoms of hypo or hyperthyroidism.    Current Medications (verified): 1)  Levoxyl 100 Mcg Tabs (Levothyroxine Sodium) .... One Tab By Mouth Daily 2)  Aggrenox 25-200 Mg Cp12 (Aspirin-Dipyridamole) .Marland Kitchen.. 1 Twice A Day By Mouth 3)  Hydrea 500 Mg Caps (Hydroxyurea) .Marland Kitchen.. 1 Per Week Tuesday Am By Mouth 4)  Sertraline Hcl 25 Mg Tabs (Sertraline Hcl) .Marland Kitchen.. 1 1/2 Once Daily/ By Mouth 5)  Haloperidol 0.5 Mg Tabs (Haloperidol) .Marland Kitchen.. 1 Twice A Day By Mouth   1 Early Am and Dinner 6)  Metoprolol Tartrate 25 Mg  Tabs (Metoprolol Tartrate) .Marland Kitchen.. 11/2 Tabs By Mouth Two Times A Day. 7)  Juice Plus Fibre  Liqd (Nutritional Supplements) .... 2 Once Daily With 8 Ounces of Water 8)  Juice Plus Fibre  Liqd (Nutritional Supplements) .... Vineyard Blend 2 At Am 9)  Juice Plus Fibre  Liqd (Nutritional Supplements) .... Veggies 2 At Dinner 10)  Sm Calcium 600 +d 600-125 Mg-Unit Tabs (Calcium Carbonate-Vitamin D) .Marland KitchenMarland KitchenMarland Kitchen 1twice A Day By Mouth 11)  Fish Oil 1000 Mg Caps (Omega-3 Fatty Acids)  .Marland Kitchen.. 1 Twice A Day By Mouth 12)  Amlodipine Besylate 10 Mg Tabs (Amlodipine Besylate) .Marland Kitchen.. 1 At Lunch By Mouth 13)  Liqui-Dualcitra 500-334 Mg/66ml Soln (Sod Citrate-Citric Acid) .... 30 Ml Twice A Day 14)  Zantac 150 Mg Tabs (Ranitidine Hcl) .Marland Kitchen.. 1 Early Am By Mouth 15)  Enalapril Maleate 2.5 Mg Tabs (Enalapril Maleate) .... Take One Tablet By Mouth Once Daily  Allergies: 1)  ! Sulfa 2)  ! Penicillin 3)  ! Pneumovax 23 (Pneumococcal Vac Polyvalent) 4)  ! * ?aggrenox 5)  ! Ramipril (Ramipril)  Past History:  Past Medical History: Last updated: 08/15/2006 Hypothyroidism Osteoporosis Depression (10/07/2005) Anxiety (10/07/2005)  Past Surgical History: Last updated: 12/04/2007 NSVD 1954 Tonsillectomy 1948 Appy 1997 Choleycystectomy 2003 L cataract 2004 R 2005 EGD/ Colonoscopy nml 07/2001      2013 Echo LVF nml EF 55-65% triv MR TR 02/17/2005 MRI/MRA Head old hemm L ext capsule sm vess dz 02/28/2005 Swallowing study ( mod barium swallow)  reasonably nml 12/19/2005 Basal Cell Posterior Neck 10/19/2006 Ct Chest Nml Nodule resolved 08/07/2007 HOSP CP, R/O'd GI Atelectasis Hyponatremia 7/18-7/19/09  Family History: Last updated: 10/29/2008 Father dec 84 Kid failure Arthrit CAD stomach Mother dec 74 ASCVD,  light strokes Brother  A 86 Falls  CAD CABGx7 stomach surg mult ventr hernia repairs Sister dec 83 Colon Ca   mets  to stomach lungs liver  Social History: Last updated: 10/12/2006 Occupation: Beautitian Married  widow 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 ENT:  Denies difficulty swallowing. CV:  Denies chest pain or discomfort. Resp:  Denies shortness of breath. GI:  Denies change in bowel habits. MS:  Denies joint pain, joint redness, and joint  swelling. Psych:  Denies anxiety and depression. Endo:  Denies cold intolerance and heat intolerance.  Physical Exam  General:  Well-developed,well-nourished,in no acute distress; alert,appropriate and cooperative throughout examination Mouth:  Oral mucosa and oropharynx without lesions or exudates.  Teeth in good repair.  Neck:  No deformities, masses, or tenderness noted. Lungs:  Normal respiratory effort, chest expands symmetrically. Lungs are clear to auscultation, no crackles or wheezes. Heart:  Normal rate and regular rhythm. S1 and S2 normal without gallop, murmur, click, rub or other extra sounds. Extremities:  No clubbing, cyanosis, or deformity noted with normal full range of motion of all joints.  no significant edema. Neurologic:  No cranial nerve deficits noted. Station and gait are normal. Plantar reflexes are down-going bilaterally. DTRs are symmetrical throughout. Sensory, motor and coordinative functions appear intact. Psych:  Oriented X3, memory intact for recent and remote, normally interactive, good eye contact, not depressed appearing, and slightly anxious.     Impression & Recommendations:  Problem # 1:  CHR KID DZ STAGE III (MOD) INTERSTIT  NEPHRITIS (DR LATEEF) (ICD-585.3) Assessment Unchanged Stable, has appointment with nephrologist next month. TLB-BMP (Basic Metabolic Panel-BMET) (80048-METABOL)  Problem # 2:  HYPERCHOLESTEROLEMIA, PURE (ICD-272.0) Assessment: Unchanged recheck lipid panel today. Orders: Venipuncture ([address]) TLB-Lipid Panel (80061-LIPID)  Problem # 3:  HYPERTENSION, BENIGN ESSENTIAL (ICD-401.1) Assessment: Unchanged stable on current meds. Her updated medication list for this problem includes:    Metoprolol Tartrate 25 Mg Tabs (Metoprolol tartrate) .Marland Kitchen... 11/2 tabs by mouth two times a day.    Amlodipine Besylate 10 Mg Tabs (Amlodipine besylate) .Marland Kitchen... 1 at lunch by mouth    Enalapril Maleate 2.5 Mg Tabs (Enalapril maleate) .Marland Kitchen... Take one  tablet by mouth once daily  Problem # 4:  HYPOTHYROIDISM (ICD-244.9) Assessment: Unchanged asymptomatic.  Continue current dose and recheck TSH. Her updated medication list for this problem includes:    Levoxyl 100 Mcg Tabs (Levothyroxine sodium) ..... One tab by mouth daily  Complete Medication List: 1)  Levoxyl 100 Mcg Tabs (Levothyroxine sodium) .... One tab by mouth daily 2)  Aggrenox 25-200 Mg Cp12 (Aspirin-dipyridamole) .Marland Kitchen.. 1 twice a day by mouth 3)  Hydrea 500 Mg Caps (Hydroxyurea) .Marland Kitchen.. 1 per week tuesday am by mouth 4)  Sertraline Hcl 25 Mg Tabs (Sertraline hcl) .Marland Kitchen.. 1 1/2 once daily/ by mouth 5)  Haloperidol 0.5 Mg Tabs (Haloperidol) .Marland Kitchen.. 1 twice a day by mouth   1 early am and dinner 6)  Metoprolol Tartrate 25 Mg Tabs (Metoprolol tartrate) .Marland Kitchen.. 11/2 tabs by mouth two times a day. 7)  Juice Plus Fibre Liqd (Nutritional supplements) .... 2 once daily with 8 ounces of water 8)  Juice Plus Fibre Liqd (Nutritional supplements) .... Vineyard blend 2 at am 9)  Juice Plus Fibre Liqd (Nutritional supplements) .... Veggies 2 at dinner 10)  Sm  Calcium 600 +d 600-125 Mg-unit Tabs (Calcium carbonate-vitamin d) .Marland KitchenMarland KitchenMarland Kitchen 1twice a day by mouth 11)  Fish Oil 1000 Mg Caps (Omega-3 fatty acids) .Marland Kitchen.. 1 twice a day by mouth 12)  Amlodipine Besylate 10 Mg Tabs (Amlodipine besylate) .Marland Kitchen.. 1 at lunch by mouth 13)  Liqui-dualcitra 500-334 Mg/78ml Soln (Sod citrate-citric acid) .... 30 ml twice a day 14)  Zantac 150 Mg Tabs (Ranitidine hcl) .Marland Kitchen.. 1 early am by mouth 15)  Enalapril Maleate 2.5 Mg Tabs (Enalapril maleate) .... Take one tablet by mouth once daily   Orders Added: 1)  Venipuncture [36415] 2)  TLB-Lipid Panel [80061-LIPID] 3)  TLB-BMP (Basic Metabolic Panel-BMET) [80048-METABOL] 4)  Est. Patient Level IV [04540]    Current Allergies (reviewed today): ! SULFA ! PENICILLIN ! PNEUMOVAX 23 (PNEUMOCOCCAL VAC POLYVALENT) ! * ?AGGRENOX ! RAMIPRIL (RAMIPRIL)  Appended Document: 6 Month  follow-up/rl

## 2010-06-10 NOTE — Progress Notes (Signed)
Summary: Rx Aggrenox  Phone Note Refill Request Call back at [phone] Message from:  [facility] on October 26, 2009 11:07 AM  Refills Requested: Medication #1:  AGGRENOX 25-200 MG CP12 1 twice a day by mouth  Method Requested: Electronic Initial call taken by: Sydell Axon LPN,  October 26, 2009 11:07 AM    Prescriptions: AGGRENOX 25-200 MG CP12 (ASPIRIN-DIPYRIDAMOLE) 1 twice a day by mouth  #180 Capsule x 3   Entered and Authorized by:   Shaune Leeks MD   Signed by:   Shaune Leeks MD on 10/26/2009   Method used:   Electronically to        [facility] [phone]* (retail)       [address] [place], [place]  [address]       Ph: [phone] or [phone]       Fax: [phone]   RxID:   [id number]

## 2010-06-10 NOTE — Assessment & Plan Note (Signed)
Summary: 30 min appt jul follow up assumption of care per dr schaller/[facility]   Vital Signs:  Patient profile:   77 year old female Height:      64 inches Weight:      151.38 pounds BMI:     26.08 Temp:     98.6 degrees F oral Pulse rate:   60 / minute Pulse rhythm:   regular BP sitting:   120 / 60  (left arm) Cuff size:   regular  Vitals Entered By: Linde Gillis CMA Duncan Dull) (November 13, 2009 3:10 PM) CC: 30 minute appt, est care from Dr. Hetty Ely   History of Present Illness: 77 yo here to establish care with me.   Pt her with friend. Had phlebotomy the last wo visits with Dr Lorre Nick, has appointment with him next [facility].  HTN- doing well.  No side effects on current meds.  No HA, blurred vision, LE edema, CP or SOB.  Stage III CKD- She saw Dr Cherylann Ratel recently and things are stable. She feels well with no complaints.  Prevention- UTD on prevention except for Zostavax.  Allergies: 1)  ! Sulfa 2)  ! Penicillin 3)  ! Pneumovax 23 (Pneumococcal Vac Polyvalent) 4)  ! * ?aggrenox 5)  ! Ramipril (Ramipril)  Review of Systems      See HPI General:  Denies malaise. CV:  Denies chest pain or discomfort. Resp:  Denies shortness of breath. MS:  Denies joint pain, joint redness, and joint swelling. Psych:  Denies anxiety and depression.  Physical Exam  General:  Well-developed,well-nourished,in no acute distress; alert,appropriate and cooperative throughout examination Lungs:  Normal respiratory effort, chest expands symmetrically. Lungs are clear to auscultation, no crackles or wheezes. Heart:  Normal rate and regular rhythm. S1 and S2 normal without gallop, murmur, click, rub or other extra sounds. Extremities:  No clubbing, cyanosis, or deformity noted with normal full range of motion of all joints.  Mild 1+ edemsa of the ankles and feet billat. Psych:  Oriented X3, memory intact for recent and remote, normally interactive, good eye contact, not depressed appearing, and slightly anxious.      Impression & Recommendations:  Problem # 1:  HYPERTENSION, BENIGN ESSENTIAL (ICD-401.1) Assessment Unchanged Well controlled.  Continue current meds. Her updated medication list for this problem includes:    Metoprolol Tartrate 25 Mg Tabs (Metoprolol tartrate) .Marland Kitchen... 11/2 tabs by mouth two times a day.    Amlodipine Besylate 10 Mg Tabs (Amlodipine besylate) .Marland Kitchen... 1 at lunch by mouth    Enalapril Maleate 2.5 Mg Tabs (Enalapril maleate) .Marland Kitchen... 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 Cherylann Ratel.  Problem # 3:  ANXIETY (ICD-300.00) Assessment: Unchanged Stable on Zoloft daily. Her updated medication list for this problem includes:    Sertraline Hcl 25 Mg Tabs (Sertraline hcl) .Marland Kitchen... 1 1/2 once daily/ by mouth  Complete Medication List: 1)  Levoxyl 100 Mcg Tabs (Levothyroxine sodium) .... One tab by mouth daily 2)  Aggrenox 25-200 Mg Cp12 (Aspirin-dipyridamole) .Marland Kitchen.. 1 twice a day by mouth 3)  Hydrea 500 Mg Caps (Hydroxyurea) .Marland Kitchen.. 1 per week tuesday am by mouth 4)  Sertraline Hcl 25 Mg Tabs (Sertraline hcl) .Marland Kitchen.. 1 1/2 once daily/ by mouth 5)  Haloperidol 0.5 Mg Tabs (Haloperidol) .Marland Kitchen.. 1 twice a day by mouth   1 early am and dinner 6)  Metoprolol Tartrate 25 Mg  Tabs (Metoprolol tartrate) .Marland Kitchen.. 11/2 tabs by mouth two times a day. 7)  Juice Plus Fibre Liqd (Nutritional supplements) .... 2 once daily with 8 ounces of water 8)  Juice Plus Fibre Liqd (Nutritional supplements) .... Vineyard blend 2 at am 9)  Juice Plus Fibre Liqd (Nutritional supplements) .... Veggies 2 at dinner 10)  Sm Calcium 600 +d 600-125 Mg-unit Tabs (Calcium carbonate-vitamin d) .Marland KitchenMarland KitchenMarland Kitchen 1twice a day by mouth 11)  Fish Oil 1000 Mg Caps (Omega-3 fatty acids) .Marland Kitchen.. 1 twice a day by mouth 12)  Amlodipine Besylate 10 Mg Tabs (Amlodipine besylate) .Marland Kitchen.. 1 at lunch by mouth 13)  Liqui-dualcitra 500-334 Mg/57ml  Soln (Sod citrate-citric acid) .... 30 ml twice a day 14)  Zantac 150 Mg Tabs (Ranitidine hcl) .Marland Kitchen.. 1 early am by mouth 15)  Enalapril Maleate 2.5 Mg Tabs (Enalapril maleate) .... Take one tablet by mouth once daily  Patient Instructions: 1)  Please schedule a follow up appoinment in 6 months. 2)  Call us when you want to schedule Shingles vaccine.  Current Allergies (reviewed today): ! SULFA ! PENICILLIN ! PNEUMOVAX 23 (PNEUMOCOCCAL VAC POLYVALENT) ! * ?AGGRENOX ! RAMIPRIL (RAMIPRIL)  Flex Sig Next Due:  Not Indicated Colonoscopy Next Due:  Not Indicated Hemoccult Next Due:  Not Indicated PAP Next Due:  Not Indicated

## 2010-06-11 ENCOUNTER — Encounter: Payer: Self-pay | Admitting: Family Medicine

## 2010-06-24 NOTE — Consult Note (Signed)
Summary: [facility]   Imported By: Maryln Gottron 06/17/2010 15:50:54  _____________________________________________________________________  External Attachment:    Type:   Image     Comment:   External Document

## 2010-06-30 ENCOUNTER — Telehealth: Payer: Self-pay | Admitting: Family Medicine

## 2010-07-06 NOTE — Progress Notes (Signed)
Summary: needs order for mammogram  Phone Note Call from Patient Call back at Home Phone [phone]   Caller: Patient Call For: Ruthe Mannan MD Summary of Call: Pt needs order for mammogram, she goes to [place]. Initial call taken by: Lowella Petties CMA, AAMA,  June 30, 2010 10:49 AM

## 2010-07-08 ENCOUNTER — Ambulatory Visit: Payer: Self-pay | Admitting: Internal Medicine

## 2010-07-27 ENCOUNTER — Ambulatory Visit: Payer: Self-pay | Admitting: Family Medicine

## 2010-08-08 ENCOUNTER — Ambulatory Visit: Payer: Self-pay | Admitting: Internal Medicine

## 2010-08-11 ENCOUNTER — Encounter: Payer: Self-pay | Admitting: Family Medicine

## 2010-08-30 ENCOUNTER — Other Ambulatory Visit: Payer: Self-pay | Admitting: Family Medicine

## 2010-09-07 ENCOUNTER — Ambulatory Visit: Payer: Self-pay | Admitting: Internal Medicine

## 2010-09-21 NOTE — Discharge Summary (Signed)
[name], [name] NO.:  [id number]   MEDICAL RECORD NO.:  [id number]          PATIENT TYPE:  INP   LOCATION:  3713                         FACILITY:  [facility]   PHYSICIAN:  Titus Dubin. Hopper, MD,FACP,FCCPDATE OF BIRTH:  01-23-1925   DATE OF ADMISSION:  11/24/2007  DATE OF DISCHARGE:  11/25/2007                               DISCHARGE SUMMARY   ADMITTING DIAGNOSIS:  Atypical chest pain, suggestive of hiatal hernia  reflux.   DISCHARGE DIAGNOSES:  1. Atypical chest pain, suggestive of hiatal hernia reflux.  2. Cardiac enzymes negative x3.   For details of the history and physical please see the dictation of November 24, 2007 ([id number]).   The patient was admitted and placed on telemetry ; protein pump  inhibitor b.i.d. was initiated.   She remained free of the symptoms.  The morning after admission, she  denied any chest pain or pressure, shortness of breath or abdominal  pain.   PHYSICAL EXAMINATION:  VITAL SIGNS:  She was afebrile.  Pulse was 69 and  regular, respiratory rate 17, and blood pressure 144/63.  O2 sats were  95% on 2 L.  HEART:  There was slight respiratory variation to her heart rhythm, but  no sustained dysrhythmias.  CHEST:  Clear.  ABDOMEN:  Nontender.  Bowel sounds are active.   Chest x-ray had revealed some right lower lobe atelectasis or air space  disease.  She was asymptomatic.   Creatinine was mildly elevated at 1.3.  Sodium was 127.  Copies of the  labs were provided to her to take to Dr. Hetty Ely.   Followup of the questionable right lower lobe changes would be  recommended along with monitor of the sodium.   She was alert and oriented, and quite interactive.   No change was made in her home medication regimen.  Omeprazole 20 mg 30  minutes before breakfast was added.   DISCHARGE STATUS:  Improved.   PROGNOSIS:  Good.   It was recommended that she avoid nonsteroidals, alcohol, peppermint,  caffeine, coffee, tea,  cola,  chocolate.  A major trigger for her has  apparently been tomato products and this was discussed.  She was not to  eat within 2-3 hours of going to bed.      Titus Dubin. Alwyn Ren, MD,FACP,FCCP  Electronically Signed     WF[facility]/MEDQ  D:  11/25/2007  T:  11/26/2007  Job:  [id number]   cc:   Arta Silence, MD

## 2010-09-21 NOTE — H&P (Signed)
NAMESTEELE, LEDONNE NO.:  [id number]   MEDICAL RECORD NO.:  [id number]          PATIENT TYPE:  INP   LOCATION:  3713                         FACILITY:  [facility]   PHYSICIAN:  Titus Dubin. Hopper, MD,FACP,FCCPDATE OF BIRTH:  11/22/24   DATE OF ADMISSION:  11/24/2007  DATE OF DISCHARGE:                              HISTORY & PHYSICAL   Donna [name] is an 77 year old white female admitted to Observation to  evaluate chest pain.   She has had substernal chest pain & shortness of breath associated with  some dizziness intermittently for several months.  This did progress  over the last 24 hours prompting the ER visit.  Additionally, she noted  some numbness in her cheeks on November 23, 2007 associated with  substernal  discomfort.   She does have a history of dyspepsia, particularly after  foods such as  tomatoes.  She did eat a tomato sandwich on November 22, 2007.   The symptoms have been worse with flexion at the waist.  She questions  whether there is a history of hiatal hernia.  The pain has not been  exertionally related.  There has been no associated nausea or sweating.  GI cocktail did help with symptoms.  She was no better with  nitroglycerin.   The pain is described as substernal and essentially constant.   Her past medical history includes polycythemia vera; hematocrit is  controlled by phlebotomy and the administration of hydroxyurea once  weekly.  Other medical problems include hypothyroidism and osteoporosis.  She has had eosinophilia, which has been progressive according to Dr.  Paula Compton office notes.  Additionally, she has had a low mean corpuscular  volume (MCV).  Endoscopic and colonoscopic exams in 2003 did not reveal  significant pathology.  She has had iron deficiency, however.  The  eosinophilia has been attributed to myeloproliferative disorder.   She has had TIAs at least 2 occasions, and her son states that she had  hemorrhagic stroke as well.   She has a history of peripheral neuropathy and is followed by her  neurologist, Dr. Avie Echevaria, [facility].  Other past medical history  includes cataract surgery bilaterally with implants.  Apparently, she  had some problems postoperatively, which has resolved  resulted in  asymmetry of the pupils.  She has had cholecystectomy, tonsillectomy,  and appendectomy.  Medical problems also include hypertension and  hypothyroidism.   Family history is negative for stroke and heart attack.  Sister had  colon cancer.   She is allergic and intolerant to PENICILLIN, SULFA, and PNEUMOVAX.   Review of systems was completed in toto.  Significant positives include  intermittent dysphagia.   She has decreased hearing.  She denies any earache or tinnitus.   She does have some frontal headaches, but only when her blood pressures  been elevated.   She has had a cough with scant sputum, but has not actually visualized  the material.  She made the comment not enough to cough up.   As noted, she had paresthesias over the lower half of the face on November 23, 2007, which persisted throughout the day.   She describes  thin skin and easy bruising.   She denies any purulent secretions from the head or chest.   She denies abdominal pain, melena, or rectal bleeding.   She has some arthralgias, but this has not required treatment.   She has a history of tremor for which she takes the haloperidol.   She denies dysuria, pyuria, or hematuria.   On exam, initial blood pressure was 175/84, pulse was 68 and regular,  respiratory rate 16, and O2 sats were 98% on room air.  As noted, pupils  are asymmetric with both pupils small, but the left minimally bigger  than the right.  Visualization of the fundi is difficult.  Dental  hygiene is fair to good.  She has all her teeth.   Otic canals are clear and tympanic membranes are normal.   Nares are patent.   Thyroid exam reveals no nodules.   She has a right  carotid bruit.   A grade 1 systolic murmurs noted at the base of the heart.   Chest is clear with no rales or rhonchi.   Abdomen is nontender without organomegaly or masses.   Posterior tibial pulses are decreased.  She has no edema.   She is alert and oriented and follows commands.  She is actually a very  good historian.   Degenerative joint changes of the hands are suggested.   Her sodium is 127, BUN 15, creatinine 1.3, hematocrit 45, and platelet  count was mildly reduced 139,000.   Initial CPK and troponin were negative.   The EMR office record was reviewed, and she has had history of  possible  pulmonary nodule.  A CT scan in March 2009 revealed there was no  evidence of acute process or any nodule.  Her last office visit  was  with Dr. Hetty Ely on July 30, 2007; this was a 57-month followup visit.  At that time, she described shortness of breath and dry hacking cough.  These are not active issues at this time.  She had been evaluated for chest pain in October 2008, right bundle  branch block was demonstrated at that time.  She was referred to the  [facility] ER for further evaluation.  CT angiography at that time did  not reveal pulmonary emboli.  There was some enlargement of pulmonary  artery.  In reviewing that report, there was also some dependent  atelectasis.  There was a 6-mm right upper lobe pulmonary nodule  suggested for which the followup CT was completed with negative findings  as noted above.   She is now admitted to Telemetry with cardiac enzymes.  We placed on a  protein pump inhibitor b.i.d.  Hemoccult stools will be collected.   If cardiac enzymes are negative, she will be discharged with followup by  Dr. Hetty Ely.      Titus Dubin. Alwyn Ren, MD,FACP,FCCP  Electronically Signed     WFH/MEDQ  D:  11/24/2007  T:  11/25/2007  Job:  [id number]   cc:   Arta Silence, MD  Dr. Lorre Nick  [facility]  Dr. Sullivan Lone

## 2010-09-24 NOTE — Consult Note (Signed)
[facility]  Patient:    Donna [name], Donna [name] Visit Number: [id number] MRN: [id number]          Service Type: FTC Location: FOOT Attending Physician:  Sharren Bridge Dictated by:   Jonelle Sports Cheryll Cockayne, M.D. Proc. Date: 11/29/01 Admit Date:  11/28/2001   CC:         Jodi Mourning, M.D.; [facility] in [place]   Consultation Report  HISTORY:  This 77 year old white female comes referred by Dr. Dan Humphreys for consideration of management of a traumatic ulcer on the distal aspect of the right lower extremity.  This has been present for some three months and has been treated with DuoDerm patch, several other topical approaches to include Silvadene, and also with courses of systemic antibiotics, initially Cipro and then later Keflex.  The patient has no history of venous disease that she is aware of in that extremity, but does tell of an episode of acute swelling in that extremity following a long airplane trip several years ago.  She has not had previous ulceration.  Apparently, she sustained blunt trauma to the pretibial area in April 2003 and that led to the opening of a wound which has persisted since and developed into an indolent ulcer.  There was an original x-ray done which showed no evidence of fracture to the tibia.  She has been treated as indicated above without relief so is here today for our further consultation.  PAST MEDICAL HISTORY:  Includes polycythemia vera which is managed with periodic phlebotomy and not with any antimetabolites.  She is hypothyroid. She has some osteoporosis and has a history of compression fracture of the thoracic spine.  ALLERGIES:  SULFA, PENICILLIN, PNEUMONIA VACCINE.  REGULAR MEDICATIONS:  Evista, calcium with vitamin D, Synthroid, aspirin, and the recent courses of antibiotics as indicated.  PHYSICAL EXAMINATION  EXTREMITIES:  Examination today is limited to the distal lower extremities. The  extremities are without significant deformity and skin temperatures are normal throughout both.  There is low grade nonpitting edema 1-2+ of the right lower extremity extending all the way to the knee.  Pulses are everywhere palpable and adequate.  There is some diminution in protective sensation on the toes and metatarsal head areas bilaterally, but no remarkable lesions on the feet themselves.  The mid right anterior pretibial area is an open ulceration measuring 14 x 10 mm and approximately 1.5 mm in depth.  Its base is quite shaggy with some slough.  There is no evidence of surrounding erythema or more deep infection at this point.  DISPOSITION: 1. The patient is given instruction regarding foot and lower extremity care in    general by video with some nurse reinforcement. 2. After the application of topical viscous lidocaine the ulcer is minimally    debrided through the use of a forceps and is somewhat cleaner once this is    completed. 3. The wound is then treated with application of accuzyme and that extremity    is placed in a dome paste wrap and then a Profore wrap as well. 4. The patient will be sent for x-ray to make sure there is no evidence of    deep infection or of osteomyelitis in this area and also for venous    Dopplers. 5. A follow-up visit will be to this clinic in one week. Dictated by:   Jonelle Sports Cheryll Cockayne, M.D. Attending Physician:  Sharren Bridge DD:  11/29/01 TD:  12/03/01 Job: [id number]

## 2010-10-08 ENCOUNTER — Ambulatory Visit: Payer: Self-pay | Admitting: Internal Medicine

## 2010-10-20 ENCOUNTER — Other Ambulatory Visit: Payer: Self-pay | Admitting: Family Medicine

## 2010-10-20 DIAGNOSIS — E7800 Pure hypercholesterolemia, unspecified: Secondary | ICD-10-CM

## 2010-10-20 DIAGNOSIS — I10 Essential (primary) hypertension: Secondary | ICD-10-CM

## 2010-10-20 DIAGNOSIS — E039 Hypothyroidism, unspecified: Secondary | ICD-10-CM

## 2010-10-25 ENCOUNTER — Other Ambulatory Visit: Payer: Self-pay | Admitting: Family Medicine

## 2010-10-28 ENCOUNTER — Other Ambulatory Visit: Payer: Self-pay | Admitting: Family Medicine

## 2010-10-28 ENCOUNTER — Other Ambulatory Visit: Payer: Self-pay | Admitting: *Deleted

## 2010-10-28 ENCOUNTER — Other Ambulatory Visit (INDEPENDENT_AMBULATORY_CARE_PROVIDER_SITE_OTHER): Payer: Medicare Other | Admitting: Family Medicine

## 2010-10-28 DIAGNOSIS — E039 Hypothyroidism, unspecified: Secondary | ICD-10-CM

## 2010-10-28 DIAGNOSIS — I10 Essential (primary) hypertension: Secondary | ICD-10-CM

## 2010-10-28 DIAGNOSIS — E7800 Pure hypercholesterolemia, unspecified: Secondary | ICD-10-CM

## 2010-10-28 LAB — LIPID PANEL
Total CHOL/HDL Ratio: 4
Triglycerides: 69 mg/dL (ref 0.0–149.0)

## 2010-10-28 LAB — HEPATIC FUNCTION PANEL
AST: 24 U/L (ref 0–37)
Albumin: 3.8 g/dL (ref 3.5–5.2)
Bilirubin, Direct: 0.2 mg/dL (ref 0.0–0.3)
Total Bilirubin: 0.6 mg/dL (ref 0.3–1.2)
Total Protein: 6.8 g/dL (ref 6.0–8.3)

## 2010-10-28 LAB — BASIC METABOLIC PANEL
Chloride: 101 mEq/L (ref 96–112)
Glucose, Bld: 85 mg/dL (ref 70–99)
Potassium: 4.3 mEq/L (ref 3.5–5.1)
Sodium: 134 mEq/L — ABNORMAL LOW (ref 135–145)

## 2010-10-28 LAB — TSH: TSH: 0.2 u[IU]/mL — ABNORMAL LOW (ref 0.35–5.50)

## 2010-10-28 MED ORDER — LEVOTHYROXINE SODIUM 75 MCG PO TABS: 75.0000 ug | ORAL_TABLET | Freq: Every day | ORAL | Status: DC

## 2010-11-04 ENCOUNTER — Encounter: Payer: Self-pay | Admitting: Family Medicine

## 2010-11-05 ENCOUNTER — Encounter: Payer: Self-pay | Admitting: Family Medicine

## 2010-11-05 ENCOUNTER — Ambulatory Visit (INDEPENDENT_AMBULATORY_CARE_PROVIDER_SITE_OTHER): Payer: Medicare Other | Admitting: Family Medicine

## 2010-11-05 DIAGNOSIS — E7800 Pure hypercholesterolemia, unspecified: Secondary | ICD-10-CM

## 2010-11-05 DIAGNOSIS — E039 Hypothyroidism, unspecified: Secondary | ICD-10-CM

## 2010-11-05 DIAGNOSIS — I10 Essential (primary) hypertension: Secondary | ICD-10-CM

## 2010-11-05 DIAGNOSIS — Z0000 Encounter for general adult medical examination without abnormal findings: Secondary | ICD-10-CM

## 2010-11-05 DIAGNOSIS — F329 Major depressive disorder, single episode, unspecified: Secondary | ICD-10-CM

## 2010-11-05 MED ORDER — SERTRALINE HCL 25 MG PO TABS: ORAL_TABLET | ORAL | Status: DC

## 2010-11-05 MED ORDER — LEVOTHYROXINE SODIUM 75 MCG PO TABS: 75.0000 ug | ORAL_TABLET | Freq: Every day | ORAL | Status: DC

## 2010-11-05 NOTE — Progress Notes (Signed)
77 yo here for [facility] Annual Wellness Visit.  I have personally reviewed the Medicare Annual Wellness questionnaire and have noted 1. The patient's medical and social history- son, Naamah Boggess, is her HPOA.  She is full code. 2. Their use of alcohol, tobacco or illicit drugs 3. Their current medications and supplements 4. The patient's functional ability including ADL's, fall risks, home safety risks and hearing or visual             Impairment.- lives alone, independent for all ADLS. 5. Diet and physical activities- remains active. 6. Evidence for depression or mood disorders- yes admits to feeling more depressed lately.  No SI or HI.  Son is filing for divorce and she is taking it hard. Currently taking Haldo 0.5 mg twice daily and Zoloft 25 mg - 1 1/2 tabs daily.  She did have to go to the hospital last month for Atypical chest pain, which has since resolved. Notes reviewed, ?GERD vs hiatal hernia.  HTN- doing well. No side effects on current meds. No HA, blurred vision, LE edema, CP or SOB.  Last went to [facility] for dilated exam in 12/2009.   Stage III CKD- followed by Dr Cherylann Ratel recently. She feels well with no complaints.  Lab Results  Component Value Date   CREATININE 1.3* 10/28/2010   Polycythemia vera- Last saw hematologist, Dr. Lorre Nick,  this month, phlebotomy was performed.  HLD- last lipid panel was stable, taking Fish oil.   Lab Results  Component Value Date   HDL 34.80* 10/28/2010   HDL 26.80* 05/27/2010   HDL 29.00* 10/21/2008    Lab Results  Component Value Date   TRIG 69.0 10/28/2010   TRIG 205.0* 05/27/2010   TRIG 88.0 10/21/2008    Lab Results  Component Value Date   LDLDIRECT 66.9 05/27/2010    Hypothyroidism- Was on Levothyroxine 100 mcg but TSH was low this month so we decreased dose to 75 mcg daily. Denies any symptoms of hypo or hyperthyroidism. Lab Results  Component Value Date   TSH 0.20* 10/28/2010   Review of Systems  See HPI    General: Denies malaise.  ENT: Denies difficulty swallowing.  CV: Denies chest pain or discomfort.  Resp: Denies shortness of breath.  GI: Denies change in bowel habits.  MS: Denies joint pain, joint redness, and joint swelling.  Psych: endorses depression, no SI or HI Endo: Denies cold intolerance and heat intolerance.   Physical Exam  BP 122/70  Pulse 72  Temp(Src) 98.3 F (36.8 C) (Oral)  Wt 149 lb 8 oz (67.813 kg)  General: Well-developed,well-nourished,in no acute distress; alert,appropriate and cooperative throughout examination  Mouth: Oral mucosa and oropharynx without lesions or exudates. Teeth in good repair.  Neck: No deformities, masses, or tenderness noted.  Lungs: Normal respiratory effort, chest expands symmetrically. Lungs are clear to auscultation, no crackles or wheezes.  Heart: Normal rate and regular rhythm. S1 and S2 normal without gallop, murmur, click, rub or other extra sounds.  Extremities: No clubbing, cyanosis, or deformity noted with normal full range of motion of all joints. no significant edema.  Neurologic: No cranial nerve deficits noted. Station and gait are normal. Plantar reflexes are down-going bilaterally. DTRs are symmetrical throughout. Sensory, motor and coordinative functions appear intact.  Psych: Oriented X3, memory intact for recent and remote, normally interactive, good eye contact, not depressed appearing, and slightly anxious.   Assessment and Plan: 1. Routine general medical examination at a health care facility  The patients  weight, height, BMI and visual acuity have been recorded in the chart I have made referrals, counseling and provided education to the patient based review of the above and I have provided the pt with a written personalized care plan for preventive services.   2. HYPOTHYROIDISM - Deteriorated.  Decreased dose of Synthroid to 75 mcg daily. Recheck TSH in 8 weeks, order already placed.  3. HYPERTENSION, BENIGN  ESSENTIAL  Stable.  4. Depression- deteriorated. Will increase Zoloft to 50 mg daily. The patient indicates understanding of these issues and agrees with the plan.

## 2010-11-05 NOTE — Patient Instructions (Signed)
I sent in an increased dose of your Zoloft to your pharmacy. Please make sure that you come back in 8 weeks to recheck your thyroid.

## 2010-11-07 ENCOUNTER — Ambulatory Visit: Payer: Self-pay | Admitting: Internal Medicine

## 2010-12-08 ENCOUNTER — Ambulatory Visit: Payer: Self-pay | Admitting: Internal Medicine

## 2010-12-19 ENCOUNTER — Other Ambulatory Visit: Payer: Self-pay | Admitting: Family Medicine

## 2011-01-05 ENCOUNTER — Other Ambulatory Visit (INDEPENDENT_AMBULATORY_CARE_PROVIDER_SITE_OTHER): Payer: Medicare Other

## 2011-01-05 DIAGNOSIS — E039 Hypothyroidism, unspecified: Secondary | ICD-10-CM

## 2011-01-06 ENCOUNTER — Other Ambulatory Visit: Payer: Medicare Other

## 2011-01-08 ENCOUNTER — Ambulatory Visit: Payer: Self-pay | Admitting: Internal Medicine

## 2011-02-04 LAB — POCT I-STAT, CHEM 8
BUN: 15
Chloride: 98
Creatinine, Ser: 1.3 — ABNORMAL HIGH
Glucose, Bld: 91
Hemoglobin: 15.3 — ABNORMAL HIGH
Potassium: 4.8

## 2011-02-04 LAB — DIFFERENTIAL
Eosinophils Absolute: 0.7
Lymphocytes Relative: 12
Lymphs Abs: 1.4
Monocytes Relative: 4
Neutrophils Relative %: 71

## 2011-02-04 LAB — CBC
MCV: 72.9 — ABNORMAL LOW
RBC: 5.5 — ABNORMAL HIGH
WBC: 11.4 — ABNORMAL HIGH

## 2011-02-04 LAB — POCT CARDIAC MARKERS
CKMB, poc: <1 — ABNORMAL LOW
Troponin i, poc: <0.05

## 2011-02-04 LAB — CK TOTAL AND CKMB (NOT AT ARMC)
CK, MB: 1.4
Relative Index: INVALID
Total CK: 28

## 2011-02-04 LAB — CARDIAC PANEL(CRET KIN+CKTOT+MB+TROPI)
CK, MB: 1.4
CK, MB: 1.5
Relative Index: INVALID
Total CK: 26

## 2011-02-04 LAB — TROPONIN I: Troponin I: 0.01

## 2011-02-07 ENCOUNTER — Ambulatory Visit: Payer: Self-pay | Admitting: Internal Medicine

## 2011-02-14 ENCOUNTER — Other Ambulatory Visit: Payer: Self-pay | Admitting: Family Medicine

## 2011-02-17 LAB — CULTURE, BLOOD (ROUTINE X 2): Culture: NO GROWTH

## 2011-02-17 LAB — POCT CARDIAC MARKERS
CKMB, poc: <1 — ABNORMAL LOW
CKMB, poc: <1 — ABNORMAL LOW
Myoglobin, poc: 63.6
Operator id: 270111
Operator id: 270111
Troponin i, poc: <0.05
Troponin i, poc: <0.05

## 2011-02-17 LAB — URINALYSIS, ROUTINE W REFLEX MICROSCOPIC
Nitrite: NEGATIVE
Protein, ur: 100 — AB
Specific Gravity, Urine: 1.008
Urobilinogen, UA: 0.2

## 2011-02-17 LAB — I-STAT 8, (EC8 V) (CONVERTED LAB)
BUN: 17
Bicarbonate: 23.6
Glucose, Bld: 105 — ABNORMAL HIGH
TCO2: 25
pCO2, Ven: 34.4 — ABNORMAL LOW
pH, Ven: 7.445 — ABNORMAL HIGH

## 2011-02-17 LAB — URINE CULTURE: Colony Count: 30000

## 2011-02-17 LAB — DIFFERENTIAL
Basophils Absolute: 0.1
Basophils Relative: 1
Eosinophils Absolute: 1 — ABNORMAL HIGH
Monocytes Absolute: 0.5
Monocytes Relative: 4
Neutro Abs: 10.3 — ABNORMAL HIGH

## 2011-02-17 LAB — D-DIMER, QUANTITATIVE: D-Dimer, Quant: 0.77 — ABNORMAL HIGH

## 2011-02-17 LAB — CBC
Hemoglobin: 12.8
MCHC: 29.7 — ABNORMAL LOW
MCV: 70.6 — ABNORMAL LOW
RBC: 6.11 — ABNORMAL HIGH
RDW: 23.8 — ABNORMAL HIGH

## 2011-02-17 LAB — URINE MICROSCOPIC-ADD ON

## 2011-02-17 LAB — POCT I-STAT CREATININE: Operator id: 270111

## 2011-03-01 ENCOUNTER — Other Ambulatory Visit (INDEPENDENT_AMBULATORY_CARE_PROVIDER_SITE_OTHER): Payer: Medicare Other

## 2011-03-01 DIAGNOSIS — E039 Hypothyroidism, unspecified: Secondary | ICD-10-CM

## 2011-03-01 LAB — TSH: TSH: 0.81 u[IU]/mL (ref 0.35–5.50)

## 2011-03-10 ENCOUNTER — Ambulatory Visit: Payer: Self-pay | Admitting: Internal Medicine

## 2011-04-09 ENCOUNTER — Ambulatory Visit: Payer: Self-pay | Admitting: Internal Medicine

## 2011-05-10 ENCOUNTER — Ambulatory Visit: Payer: Self-pay | Admitting: Internal Medicine

## 2011-05-19 LAB — CBC CANCER CENTER
Basophil %: 1.6 %
Eosinophil #: 0.5 x10 3/mm (ref 0.0–0.7)
Eosinophil %: 6.8 %
HCT: 42.8 % (ref 35.0–47.0)
HGB: 13 g/dL (ref 12.0–16.0)
Lymphocyte %: 20.9 %
MCH: 26.4 pg (ref 26.0–34.0)
MCHC: 30.5 g/dL — ABNORMAL LOW (ref 32.0–36.0)
Monocyte %: 4.3 %
Neutrophil #: 4.7 x10 3/mm (ref 1.4–6.5)
Neutrophil %: 66.4 %

## 2011-06-02 LAB — CBC CANCER CENTER
Basophil #: 0 x10 3/mm (ref 0.0–0.1)
Basophil %: 0.4 %
Eosinophil #: 0.5 x10 3/mm (ref 0.0–0.7)
HCT: 42.4 % (ref 35.0–47.0)
HGB: 13 g/dL (ref 12.0–16.0)
Lymphocyte #: 1.8 x10 3/mm (ref 1.0–3.6)
MCH: 26.5 pg (ref 26.0–34.0)
MCHC: 30.7 g/dL — ABNORMAL LOW (ref 32.0–36.0)
MCV: 86 fL (ref 80–100)
Monocyte %: 3.7 %
Neutrophil #: 4.7 x10 3/mm (ref 1.4–6.5)
RDW: 22.4 % — ABNORMAL HIGH (ref 11.5–14.5)
WBC: 7.3 x10 3/mm (ref 3.6–11.0)

## 2011-06-10 ENCOUNTER — Ambulatory Visit: Payer: Self-pay | Admitting: Internal Medicine

## 2011-06-30 LAB — CBC CANCER CENTER
Basophil #: 0.1 x10 3/mm (ref 0.0–0.1)
Eosinophil #: 0.5 x10 3/mm (ref 0.0–0.7)
Lymphocyte #: 1.6 x10 3/mm (ref 1.0–3.6)
Lymphocyte %: 23.5 %
Monocyte #: 0.3 x10 3/mm (ref 0.0–0.7)
Neutrophil %: 64.2 %
Platelet: 145 x10 3/mm — ABNORMAL LOW (ref 150–440)
RBC: 5.25 10*6/uL — ABNORMAL HIGH (ref 3.80–5.20)
RDW: 22.8 % — ABNORMAL HIGH (ref 11.5–14.5)
WBC: 6.7 x10 3/mm (ref 3.6–11.0)

## 2011-07-08 ENCOUNTER — Ambulatory Visit: Payer: Self-pay | Admitting: Internal Medicine

## 2011-07-28 LAB — CBC CANCER CENTER
Basophil %: 1.5 %
Eosinophil #: 0.6 x10 3/mm (ref 0.0–0.7)
Eosinophil %: 7.1 %
HCT: 47.7 % — ABNORMAL HIGH (ref 35.0–47.0)
HGB: 14.6 g/dL (ref 12.0–16.0)
Lymphocyte %: 20.4 %
MCH: 26.9 pg (ref 26.0–34.0)
MCHC: 30.6 g/dL — ABNORMAL LOW (ref 32.0–36.0)
MCV: 88 fL (ref 80–100)
Monocyte #: 0.4 x10 3/mm (ref 0.0–0.7)
Neutrophil #: 5.2 x10 3/mm (ref 1.4–6.5)
Neutrophil %: 66 %
RDW: 23.2 % — ABNORMAL HIGH (ref 11.5–14.5)
WBC: 7.8 x10 3/mm (ref 3.6–11.0)

## 2011-08-07 ENCOUNTER — Other Ambulatory Visit: Payer: Self-pay | Admitting: Family Medicine

## 2011-08-18 ENCOUNTER — Other Ambulatory Visit: Payer: Self-pay | Admitting: Family Medicine

## 2011-08-18 DIAGNOSIS — E7800 Pure hypercholesterolemia, unspecified: Secondary | ICD-10-CM

## 2011-08-18 DIAGNOSIS — Z0000 Encounter for general adult medical examination without abnormal findings: Secondary | ICD-10-CM

## 2011-08-18 DIAGNOSIS — E039 Hypothyroidism, unspecified: Secondary | ICD-10-CM

## 2011-08-23 ENCOUNTER — Other Ambulatory Visit (INDEPENDENT_AMBULATORY_CARE_PROVIDER_SITE_OTHER): Payer: Medicare Other

## 2011-08-23 DIAGNOSIS — E039 Hypothyroidism, unspecified: Secondary | ICD-10-CM

## 2011-08-23 DIAGNOSIS — E7800 Pure hypercholesterolemia, unspecified: Secondary | ICD-10-CM

## 2011-08-23 DIAGNOSIS — Z0000 Encounter for general adult medical examination without abnormal findings: Secondary | ICD-10-CM

## 2011-08-23 LAB — COMPREHENSIVE METABOLIC PANEL
AST: 22 U/L (ref 0–37)
Alkaline Phosphatase: 46 U/L (ref 39–117)
BUN: 21 mg/dL (ref 6–23)
Glucose, Bld: 81 mg/dL (ref 70–99)
Sodium: 138 mEq/L (ref 135–145)
Total Bilirubin: 0.5 mg/dL (ref 0.3–1.2)

## 2011-08-23 LAB — LIPID PANEL
Cholesterol: 122 mg/dL (ref 0–200)
LDL Cholesterol: 74 mg/dL (ref 0–99)
Triglycerides: 64 mg/dL (ref 0.0–149.0)
VLDL: 12.8 mg/dL (ref 0.0–40.0)

## 2011-08-25 ENCOUNTER — Ambulatory Visit: Payer: Self-pay | Admitting: Internal Medicine

## 2011-08-25 LAB — CBC CANCER CENTER
Basophil #: 0 x10 3/mm (ref 0.0–0.1)
Eosinophil #: 0.6 x10 3/mm (ref 0.0–0.7)
HGB: 13.5 g/dL (ref 12.0–16.0)
Lymphocyte #: 1.8 x10 3/mm (ref 1.0–3.6)
MCH: 26.4 pg (ref 26.0–34.0)
MCV: 90 fL (ref 80–100)
Monocyte #: 0.4 x10 3/mm (ref 0.2–0.9)
Neutrophil #: 4.7 x10 3/mm (ref 1.4–6.5)
RBC: 5.12 10*6/uL (ref 3.80–5.20)
RDW: 22 % — ABNORMAL HIGH (ref 11.5–14.5)

## 2011-08-30 ENCOUNTER — Encounter: Payer: Medicare Other | Admitting: Family Medicine

## 2011-09-06 ENCOUNTER — Ambulatory Visit: Payer: Self-pay | Admitting: Family Medicine

## 2011-09-06 ENCOUNTER — Encounter: Payer: Self-pay | Admitting: Family Medicine

## 2011-09-07 ENCOUNTER — Ambulatory Visit: Payer: Self-pay | Admitting: Internal Medicine

## 2011-09-07 ENCOUNTER — Encounter: Payer: Self-pay | Admitting: *Deleted

## 2011-09-07 ENCOUNTER — Encounter: Payer: Self-pay | Admitting: Family Medicine

## 2011-09-22 LAB — CBC CANCER CENTER
Basophil %: 0.4 %
Eosinophil %: 8.1 %
Lymphocyte #: 1.4 x10 3/mm (ref 1.0–3.6)
Lymphocyte %: 20.3 %
MCHC: 29.2 g/dL — ABNORMAL LOW (ref 32.0–36.0)
MCV: 90 fL (ref 80–100)
Monocyte #: 0.3 x10 3/mm (ref 0.2–0.9)
Neutrophil #: 4.5 x10 3/mm (ref 1.4–6.5)
Platelet: 134 x10 3/mm — ABNORMAL LOW (ref 150–440)
RBC: 4.95 10*6/uL (ref 3.80–5.20)
RDW: 21.8 % — ABNORMAL HIGH (ref 11.5–14.5)
WBC: 6.8 x10 3/mm (ref 3.6–11.0)

## 2011-10-08 ENCOUNTER — Ambulatory Visit: Payer: Self-pay | Admitting: Internal Medicine

## 2011-10-20 LAB — CBC CANCER CENTER
Basophil #: 0.2 x10 3/mm — ABNORMAL HIGH (ref 0.0–0.1)
Eosinophil %: 8.3 %
HCT: 43.2 % (ref 35.0–47.0)
Lymphocyte #: 1.6 x10 3/mm (ref 1.0–3.6)
MCH: 25.6 pg — ABNORMAL LOW (ref 26.0–34.0)
Monocyte #: 0.4 x10 3/mm (ref 0.2–0.9)
Monocyte %: 5.3 %
Neutrophil %: 64 %
Platelet: 159 x10 3/mm (ref 150–440)
WBC: 8.2 x10 3/mm (ref 3.6–11.0)

## 2011-10-30 ENCOUNTER — Other Ambulatory Visit: Payer: Self-pay | Admitting: Family Medicine

## 2011-10-31 NOTE — Telephone Encounter (Signed)
Pt has appt on 11/08/11.

## 2011-11-07 ENCOUNTER — Ambulatory Visit: Payer: Self-pay | Admitting: Internal Medicine

## 2011-11-08 ENCOUNTER — Encounter: Payer: Self-pay | Admitting: Family Medicine

## 2011-11-08 ENCOUNTER — Ambulatory Visit (INDEPENDENT_AMBULATORY_CARE_PROVIDER_SITE_OTHER): Payer: Medicare Other | Admitting: Family Medicine

## 2011-11-08 VITALS — BP 102/60 | HR 64 | Temp 98.1°F | Ht <= 58 in | Wt 144.0 lb

## 2011-11-08 DIAGNOSIS — I10 Essential (primary) hypertension: Secondary | ICD-10-CM

## 2011-11-08 DIAGNOSIS — E039 Hypothyroidism, unspecified: Secondary | ICD-10-CM

## 2011-11-08 DIAGNOSIS — Z0000 Encounter for general adult medical examination without abnormal findings: Secondary | ICD-10-CM

## 2011-11-08 DIAGNOSIS — E7800 Pure hypercholesterolemia, unspecified: Secondary | ICD-10-CM

## 2011-11-08 DIAGNOSIS — Z8679 Personal history of other diseases of the circulatory system: Secondary | ICD-10-CM

## 2011-11-08 NOTE — Patient Instructions (Addendum)
It was great to see you. Instead of Aggrenox, take Aspirin 81 mg daily.

## 2011-11-08 NOTE — Progress Notes (Signed)
78 yo here for [facility] Annual Wellness Visit.  I have personally reviewed the Medicare Annual Wellness questionnaire and have noted 1. The patient's medical and social history- son, Nevin Kozuch, is her HPOA.  She is full code. 2. Their use of alcohol, tobacco or illicit drugs 3. Their current medications and supplements 4. The patient's functional ability including ADL's, fall risks, home safety risks and hearing or visual             Impairment.- lives alone, independent for all ADLS. 5. Diet and physical activities- remains active. 6. Evidence for depression or mood disorders  Depression- doing well off zoloft.   Her son got her a puppy named Evaristo Bury and he has really brightened her mood and decreased her anxiety and depression.  H/o TIAs- She would like to discuss her medications today. She has been on Aggrenox for years but she will be in the donut hole next month- it cost $900 per month and she cannot afford it. She wonders what else she can take.  HTN- doing well. No side effects on current meds. No HA, blurred vision, LE edema, CP or SOB.    Stage III CKD- followed by Dr Cherylann Ratel recently. She feels well with no complaints.  Lab Results  Component Value Date   CREATININE 1.5* 08/23/2011   Polycythemia vera- Last saw hematologist, Dr. Lorre Nick,  this month, phlebotomy was performed.  HLD- last lipid panel was stable, taking Fish oil.   Lab Results  Component Value Date   CHOL 122 08/23/2011   HDL 35.20* 08/23/2011   LDLCALC 74 08/23/2011   LDLDIRECT 66.9 05/27/2010   TRIG 64.0 08/23/2011   CHOLHDL 3 08/23/2011      Hypothyroidism- Denies any symptoms of hypo or hyperthyroidism. Lab Results  Component Value Date   TSH 1.45 08/23/2011   Patient Active Problem List  Diagnosis  . CARCINOMA, BASAL CELL, NECK  . NEOPLASM, SKIN, UNCERTAIN BEHAVIOR  . HYPOTHYROIDISM  . HYPERCHOLESTEROLEMIA, PURE  . ANXIETY  . DEPRESSION  . HYPERTENSION, BENIGN ESSENTIAL  . PULMONARY NODULE,  RIGHT UPPER LOBE  . OSTEOPOROSIS  . TRANSIENT ISCHEMIC ATTACKS, HX OF  . Routine general medical examination at a health care facility   No past medical history on file. No past surgical history on file. History  Substance Use Topics  . Smoking status: Never Smoker   . Smokeless tobacco: Not on file  . Alcohol Use: Not on file   No family history on file. Allergies  Allergen Reactions  . Penicillins     REACTION: UNSPECIFIED  . Pneumococcal Vaccine Polyvalent   . Ramipril     REACTION: renal insuff  . Sulfonamide Derivatives    Current Outpatient Prescriptions on File Prior to Visit  Medication Sig Dispense Refill  . AGGRENOX 25-200 MG per 12 hr capsule TAKE ONE CAPSULE BY MOUTH TWICE A DAY  180 capsule  0  . amLODipine (NORVASC) 10 MG tablet Take 10 mg by mouth daily.       . Calcium Carbonate-Vitamin D (CALCIUM 600/VITAMIN D) 600-400 MG-UNIT per tablet Take 1 tablet by mouth 2 (two) times daily.        . ciprofloxacin (CIPRO) 250 MG tablet Take 250 mg by mouth 2 (two) times daily.       . enalapril (VASOTEC) 2.5 MG tablet Take 2.5 mg by mouth daily.       . haloperidol (HALDOL) 0.5 MG tablet 1 TWICE A DAY BY MOUTH 1 EARLY AM AND DINNER  180 tablet  0  . hydroxyurea (HYDREA) 500 MG capsule Take 500 mg by mouth once a week.       . levothyroxine (SYNTHROID) 75 MCG tablet Take 1 tablet (75 mcg total) by mouth daily.  30 tablet  11  . levothyroxine (SYNTHROID, LEVOTHROID) 100 MCG tablet TAKE 1 TABLET BY MOUTH EVERY DAY  90 tablet  1  . levothyroxine (SYNTHROID, LEVOTHROID) 75 MCG tablet TAKE 1 TABLET EVERY DAY  60 tablet  0  . metoprolol tartrate (LOPRESSOR) 25 MG tablet TAKE 1 & 1/2 TABLETS TWICE A DAY  270 tablet  3  . Nutritional Supplements (JUICE PLUS FIBRE PO) Take 2 capsules by mouth daily.        . Omega-3 Fatty Acids (FISH OIL) 300 MG CAPS Take 1 capsule by mouth daily.        . sertraline (ZOLOFT) 25 MG tablet Take 2 tablets by mouth daily  60 tablet  3  . Sod  Citrate-Citric Acid (CYTRA-2) 500-334 MG/5ML SOLN Take 30 mLs by mouth 2 (two) times daily.         Review of Systems  See HPI  Patient reports no  vision/ hearing changes,anorexia, weight change, fever ,adenopathy, persistant / recurrent hoarseness, swallowing issues, chest pain, edema,persistant / recurrent cough, hemoptysis, dyspnea(rest, exertional, paroxysmal nocturnal), gastrointestinal  bleeding (melena, rectal bleeding), abdominal pain, excessive heart burn, GU symptoms(dysuria, hematuria, pyuria, voiding/incontinence  Issues) syncope, focal weakness, severe memory loss, concerning skin lesions, depression, anxiety, abnormal bruising/bleeding, major joint swelling, breast masses or abnormal vaginal bleeding.     Physical Exam  BP 102/60  Pulse 64  Temp 98.1 F (36.7 C)  Ht 4' 5.5" (1.359 m)  Wt 144 lb (65.318 kg)  BMI 35.37 kg/m2  General: Well-developed,well-nourished,in no acute distress; alert,appropriate and cooperative throughout examination  Mouth: Oral mucosa and oropharynx without lesions or exudates. Teeth in good repair.  Neck: No deformities, masses, or tenderness noted.  Lungs: Normal respiratory effort, chest expands symmetrically. Lungs are clear to auscultation, no crackles or wheezes.  Heart: Normal rate and regular rhythm. S1 and S2 normal without gallop, murmur, click, rub or other extra sounds.  Extremities: No clubbing, cyanosis, or deformity noted with normal full range of motion of all joints. no significant edema.  Neurologic: No cranial nerve deficits noted. Station and gait are normal. Plantar reflexes are down-going bilaterally. DTRs are symmetrical throughout. Sensory, motor and coordinative functions appear intact.  Psych: Oriented X3, memory intact for recent and remote, normally interactive, good eye contact, not depressed appearing, and slightly anxious.   Assessment and Plan: 1. Routine general medical examination at a health care facility  The  patients weight, height, BMI and visual acuity have been recorded in the chart I have made referrals, counseling and provided education to the patient based review of the above and I have provided the pt with a written personalized care plan for preventive services.   2. HYPOTHYROIDISM  Stable on current dose of synthroid.  3. HYPERTENSION, BENIGN ESSENTIAL  Stable on Norvasc, lopressor.  4. HYPERCHOLESTEROLEMIA, PURE  Lipid panel stable in April 2013.  5. TRANSIENT ISCHEMIC ATTACKS, HX OF  Discussed antiplatelet therapies.  Suggested plavix but she does not want to pay for any new medications.  Since she is refusing Aggrenox and plavix, I advised ASA 81 mg daily. The patient indicates understanding of these issues and agrees with the plan.

## 2011-11-17 LAB — CBC CANCER CENTER
Basophil #: 0.2 x10 3/mm — ABNORMAL HIGH (ref 0.0–0.1)
Eosinophil #: 0.6 x10 3/mm (ref 0.0–0.7)
Eosinophil %: 9.1 %
Monocyte #: 0.3 x10 3/mm (ref 0.2–0.9)
Monocyte %: 5.4 %
Neutrophil #: 4 x10 3/mm (ref 1.4–6.5)
Neutrophil %: 62.6 %
Platelet: 145 x10 3/mm — ABNORMAL LOW (ref 150–440)
RBC: 4.56 10*6/uL (ref 3.80–5.20)
RDW: 21.3 % — ABNORMAL HIGH (ref 11.5–14.5)
WBC: 6.4 x10 3/mm (ref 3.6–11.0)

## 2011-12-08 ENCOUNTER — Ambulatory Visit: Payer: Self-pay | Admitting: Internal Medicine

## 2011-12-15 LAB — CBC CANCER CENTER
Basophil #: 0.2 x10 3/mm — ABNORMAL HIGH (ref 0.0–0.1)
Basophil %: 2.7 %
Eosinophil #: 0.7 x10 3/mm (ref 0.0–0.7)
Eosinophil %: 8.8 %
Lymphocyte %: 17.4 %
MCH: 24.7 pg — ABNORMAL LOW (ref 26.0–34.0)
MCHC: 29.8 g/dL — ABNORMAL LOW (ref 32.0–36.0)
Monocyte #: 0.4 x10 3/mm (ref 0.2–0.9)
Neutrophil %: 65.7 %
Platelet: 152 x10 3/mm (ref 150–440)
RBC: 4.92 10*6/uL (ref 3.80–5.20)
WBC: 8 x10 3/mm (ref 3.6–11.0)

## 2012-01-05 LAB — CBC CANCER CENTER
Basophil %: 0.6 %
Eosinophil %: 8.9 %
HCT: 41.6 % (ref 35.0–47.0)
HGB: 11.9 g/dL — ABNORMAL LOW (ref 12.0–16.0)
Lymphocyte #: 1.4 x10 3/mm (ref 1.0–3.6)
Lymphocyte %: 19 %
MCV: 84 fL (ref 80–100)
Monocyte #: 0.3 x10 3/mm (ref 0.2–0.9)
Monocyte %: 4.5 %
Platelet: 151 x10 3/mm (ref 150–440)
WBC: 7.6 x10 3/mm (ref 3.6–11.0)

## 2012-01-08 ENCOUNTER — Ambulatory Visit: Payer: Self-pay | Admitting: Internal Medicine

## 2012-02-02 LAB — CBC CANCER CENTER
Basophil #: 0.4 x10 3/mm — ABNORMAL HIGH (ref 0.0–0.1)
Lymphocyte #: 1.2 x10 3/mm (ref 1.0–3.6)
Lymphocyte %: 17.8 %
MCV: 82 fL (ref 80–100)
Monocyte #: 0.3 x10 3/mm (ref 0.2–0.9)
Monocyte %: 4.9 %
Platelet: 157 x10 3/mm (ref 150–440)
RDW: 22.2 % — ABNORMAL HIGH (ref 11.5–14.5)
WBC: 7 x10 3/mm (ref 3.6–11.0)

## 2012-02-05 ENCOUNTER — Other Ambulatory Visit: Payer: Self-pay | Admitting: Family Medicine

## 2012-02-07 ENCOUNTER — Ambulatory Visit: Payer: Self-pay | Admitting: Internal Medicine

## 2012-02-12 ENCOUNTER — Other Ambulatory Visit: Payer: Self-pay | Admitting: Family Medicine

## 2012-03-01 LAB — CBC CANCER CENTER
Basophil %: 1.8 %
Eosinophil %: 8 %
HGB: 11.9 g/dL — ABNORMAL LOW (ref 12.0–16.0)
Lymphocyte %: 20.5 %
MCH: 24 pg — ABNORMAL LOW (ref 26.0–34.0)
MCHC: 29.9 g/dL — ABNORMAL LOW (ref 32.0–36.0)
Monocyte #: 0.5 x10 3/mm (ref 0.2–0.9)
Monocyte %: 5.4 %
Neutrophil %: 64.3 %
Platelet: 159 x10 3/mm (ref 150–440)
RBC: 4.95 10*6/uL (ref 3.80–5.20)
WBC: 9.3 x10 3/mm (ref 3.6–11.0)

## 2012-03-09 ENCOUNTER — Ambulatory Visit: Payer: Self-pay | Admitting: Internal Medicine

## 2012-03-29 LAB — CBC CANCER CENTER
Basophil %: 0.6 %
Eosinophil #: 0.7 x10 3/mm (ref 0.0–0.7)
HCT: 39.5 % (ref 35.0–47.0)
Lymphocyte %: 16.6 %
MCH: 23.2 pg — ABNORMAL LOW (ref 26.0–34.0)
MCHC: 28.5 g/dL — ABNORMAL LOW (ref 32.0–36.0)
Monocyte #: 0.5 x10 3/mm (ref 0.2–0.9)
Neutrophil #: 6 x10 3/mm (ref 1.4–6.5)
RDW: 22 % — ABNORMAL HIGH (ref 11.5–14.5)

## 2012-04-08 ENCOUNTER — Ambulatory Visit: Payer: Self-pay | Admitting: Internal Medicine

## 2012-04-26 LAB — CBC CANCER CENTER
Basophil #: 0.3 x10 3/mm — ABNORMAL HIGH (ref 0.0–0.1)
Eosinophil #: 0.8 x10 3/mm — ABNORMAL HIGH (ref 0.0–0.7)
HCT: 42.5 % (ref 35.0–47.0)
Lymphocyte #: 1.6 x10 3/mm (ref 1.0–3.6)
MCH: 23.5 pg — ABNORMAL LOW (ref 26.0–34.0)
MCV: 79 fL — ABNORMAL LOW (ref 80–100)
Monocyte #: 0.5 x10 3/mm (ref 0.2–0.9)
Monocyte %: 5.2 %
Neutrophil %: 66.7 %
Platelet: 155 x10 3/mm (ref 150–440)
RBC: 5.38 10*6/uL — ABNORMAL HIGH (ref 3.80–5.20)
RDW: 22.4 % — ABNORMAL HIGH (ref 11.5–14.5)
WBC: 9.6 x10 3/mm (ref 3.6–11.0)

## 2012-05-09 ENCOUNTER — Ambulatory Visit: Payer: Self-pay | Admitting: Internal Medicine

## 2012-05-13 ENCOUNTER — Other Ambulatory Visit: Payer: Self-pay | Admitting: Family Medicine

## 2012-05-24 LAB — CBC CANCER CENTER
Basophil #: 0.1 x10 3/mm (ref 0.0–0.1)
Basophil %: 0.5 %
Eosinophil %: 8.4 %
HCT: 42.5 % (ref 35.0–47.0)
MCHC: 29.6 g/dL — ABNORMAL LOW (ref 32.0–36.0)
MCV: 78 fL — ABNORMAL LOW (ref 80–100)
Monocyte %: 4.1 %
Neutrophil %: 70.9 %
RBC: 5.45 10*6/uL — ABNORMAL HIGH (ref 3.80–5.20)
RDW: 22.8 % — ABNORMAL HIGH (ref 11.5–14.5)
WBC: 11 x10 3/mm (ref 3.6–11.0)

## 2012-06-09 ENCOUNTER — Ambulatory Visit: Payer: Self-pay | Admitting: Internal Medicine

## 2012-06-25 LAB — CBC CANCER CENTER
Basophil #: 0 x10 3/mm (ref 0.0–0.1)
Basophil %: 0.5 %
HCT: 40.2 % (ref 35.0–47.0)
Lymphocyte #: 2.3 x10 3/mm (ref 1.0–3.6)
Lymphocyte %: 24.4 %
MCH: 22.7 pg — ABNORMAL LOW (ref 26.0–34.0)
MCHC: 29.7 g/dL — ABNORMAL LOW (ref 32.0–36.0)
Monocyte #: 0.4 x10 3/mm (ref 0.2–0.9)
Neutrophil #: 5.7 x10 3/mm (ref 1.4–6.5)
Platelet: 148 x10 3/mm — ABNORMAL LOW (ref 150–440)
RBC: 5.26 10*6/uL — ABNORMAL HIGH (ref 3.80–5.20)
RDW: 22.3 % — ABNORMAL HIGH (ref 11.5–14.5)

## 2012-07-07 ENCOUNTER — Ambulatory Visit: Payer: Self-pay | Admitting: Internal Medicine

## 2012-07-19 LAB — CBC CANCER CENTER
Basophil #: 0.2 x10 3/mm — ABNORMAL HIGH (ref 0.0–0.1)
Basophil %: 2.2 %
Eosinophil %: 6.9 %
HGB: 12.6 g/dL (ref 12.0–16.0)
MCH: 22.2 pg — ABNORMAL LOW (ref 26.0–34.0)
MCV: 77 fL — ABNORMAL LOW (ref 80–100)
Monocyte #: 0.3 x10 3/mm (ref 0.2–0.9)
Neutrophil #: 6.8 x10 3/mm — ABNORMAL HIGH (ref 1.4–6.5)
Neutrophil %: 70.5 %
Platelet: 161 x10 3/mm (ref 150–440)
RDW: 23.3 % — ABNORMAL HIGH (ref 11.5–14.5)

## 2012-08-06 ENCOUNTER — Other Ambulatory Visit: Payer: Self-pay | Admitting: Family Medicine

## 2012-08-07 ENCOUNTER — Ambulatory Visit: Payer: Self-pay | Admitting: Internal Medicine

## 2012-08-16 LAB — CBC CANCER CENTER
Basophil #: 0.2 x10 3/mm — ABNORMAL HIGH (ref 0.0–0.1)
Basophil %: 2.1 %
Eosinophil #: 0.8 x10 3/mm — ABNORMAL HIGH (ref 0.0–0.7)
Eosinophil %: 8.6 %
HCT: 43.4 % (ref 35.0–47.0)
Lymphocyte #: 1.5 x10 3/mm (ref 1.0–3.6)
Lymphocyte %: 16.9 %
MCHC: 29 g/dL — ABNORMAL LOW (ref 32.0–36.0)
MCV: 76 fL — ABNORMAL LOW (ref 80–100)
Neutrophil %: 67.9 %
RBC: 5.67 10*6/uL — ABNORMAL HIGH (ref 3.80–5.20)
RDW: 22.9 % — ABNORMAL HIGH (ref 11.5–14.5)
WBC: 8.8 x10 3/mm (ref 3.6–11.0)

## 2012-09-06 ENCOUNTER — Ambulatory Visit: Payer: Self-pay | Admitting: Internal Medicine

## 2012-09-13 ENCOUNTER — Ambulatory Visit: Payer: Self-pay | Admitting: Internal Medicine

## 2012-09-13 LAB — CBC CANCER CENTER
Basophil #: 0.2 x10 3/mm — ABNORMAL HIGH (ref 0.0–0.1)
Basophil %: 1.8 %
Eosinophil %: 9.1 %
Lymphocyte #: 1.8 x10 3/mm (ref 1.0–3.6)
MCH: 22.1 pg — ABNORMAL LOW (ref 26.0–34.0)
MCV: 75 fL — ABNORMAL LOW (ref 80–100)
Monocyte #: 0.4 x10 3/mm (ref 0.2–0.9)
Neutrophil #: 6.2 x10 3/mm (ref 1.4–6.5)
Neutrophil %: 65.8 %
RBC: 5.44 10*6/uL — ABNORMAL HIGH (ref 3.80–5.20)
RDW: 22.5 % — ABNORMAL HIGH (ref 11.5–14.5)

## 2012-09-25 ENCOUNTER — Ambulatory Visit: Payer: Self-pay | Admitting: Family Medicine

## 2012-09-26 ENCOUNTER — Encounter: Payer: Self-pay | Admitting: Family Medicine

## 2012-09-26 ENCOUNTER — Encounter: Payer: Self-pay | Admitting: *Deleted

## 2012-09-26 LAB — HM MAMMOGRAPHY: HM Mammogram: NORMAL

## 2012-10-07 ENCOUNTER — Ambulatory Visit: Payer: Self-pay | Admitting: Family Medicine

## 2012-10-07 ENCOUNTER — Ambulatory Visit: Payer: Self-pay | Admitting: Internal Medicine

## 2012-10-29 ENCOUNTER — Other Ambulatory Visit: Payer: Self-pay | Admitting: Family Medicine

## 2012-11-01 LAB — CBC CANCER CENTER
Basophil %: 2.1 %
HGB: 12.6 g/dL (ref 12.0–16.0)
Lymphocyte #: 1.8 x10 3/mm (ref 1.0–3.6)
Lymphocyte %: 16.9 %
MCHC: 30.1 g/dL — ABNORMAL LOW (ref 32.0–36.0)
MCV: 75 fL — ABNORMAL LOW (ref 80–100)
Monocyte %: 5 %
Neutrophil #: 6.7 x10 3/mm — ABNORMAL HIGH (ref 1.4–6.5)
Neutrophil %: 63.4 %
Platelet: 161 x10 3/mm (ref 150–440)
WBC: 10.6 x10 3/mm (ref 3.6–11.0)

## 2012-11-04 ENCOUNTER — Other Ambulatory Visit: Payer: Self-pay | Admitting: Family Medicine

## 2012-11-06 ENCOUNTER — Ambulatory Visit: Payer: Self-pay | Admitting: Internal Medicine

## 2012-11-22 LAB — CBC CANCER CENTER
Basophil #: 0.3 x10 3/mm — ABNORMAL HIGH (ref 0.0–0.1)
Basophil %: 2.8 %
Eosinophil #: 1.5 x10 3/mm — ABNORMAL HIGH (ref 0.0–0.7)
Eosinophil %: 13.7 %
HCT: 43.4 % (ref 35.0–47.0)
Lymphocyte #: 1.9 x10 3/mm (ref 1.0–3.6)
Lymphocyte %: 17.7 %
MCH: 22.4 pg — ABNORMAL LOW (ref 26.0–34.0)
MCHC: 29.7 g/dL — ABNORMAL LOW (ref 32.0–36.0)
MCV: 76 fL — ABNORMAL LOW (ref 80–100)
Monocyte #: 0.4 x10 3/mm (ref 0.2–0.9)
Monocyte %: 3.7 %
Neutrophil %: 62.1 %
Platelet: 170 x10 3/mm (ref 150–440)
RBC: 5.75 10*6/uL — ABNORMAL HIGH (ref 3.80–5.20)
RDW: 23.5 % — ABNORMAL HIGH (ref 11.5–14.5)
WBC: 10.7 x10 3/mm (ref 3.6–11.0)

## 2012-12-07 ENCOUNTER — Ambulatory Visit: Payer: Self-pay | Admitting: Internal Medicine

## 2012-12-20 LAB — CBC CANCER CENTER
Basophil #: 0.4 x10 3/mm — ABNORMAL HIGH (ref 0.0–0.1)
Eosinophil #: 1 x10 3/mm — ABNORMAL HIGH (ref 0.0–0.7)
Eosinophil %: 9.7 %
HCT: 37.1 % (ref 35.0–47.0)
HGB: 11.4 g/dL — ABNORMAL LOW (ref 12.0–16.0)
Lymphocyte %: 14.4 %
MCH: 23.2 pg — ABNORMAL LOW (ref 26.0–34.0)
MCHC: 30.6 g/dL — ABNORMAL LOW (ref 32.0–36.0)
MCV: 76 fL — ABNORMAL LOW (ref 80–100)
Monocyte #: 0.4 x10 3/mm (ref 0.2–0.9)
Neutrophil #: 6.7 x10 3/mm — ABNORMAL HIGH (ref 1.4–6.5)
Neutrophil %: 67.8 %
RBC: 4.91 10*6/uL (ref 3.80–5.20)
RDW: 22.7 % — ABNORMAL HIGH (ref 11.5–14.5)
WBC: 9.8 x10 3/mm (ref 3.6–11.0)

## 2013-01-02 ENCOUNTER — Encounter: Payer: Self-pay | Admitting: Family Medicine

## 2013-01-07 ENCOUNTER — Ambulatory Visit: Payer: Self-pay | Admitting: Internal Medicine

## 2013-01-11 ENCOUNTER — Other Ambulatory Visit: Payer: Self-pay | Admitting: Family Medicine

## 2013-01-11 DIAGNOSIS — I10 Essential (primary) hypertension: Secondary | ICD-10-CM

## 2013-01-11 DIAGNOSIS — E7800 Pure hypercholesterolemia, unspecified: Secondary | ICD-10-CM

## 2013-01-11 DIAGNOSIS — E039 Hypothyroidism, unspecified: Secondary | ICD-10-CM

## 2013-01-11 DIAGNOSIS — Z0000 Encounter for general adult medical examination without abnormal findings: Secondary | ICD-10-CM

## 2013-01-16 ENCOUNTER — Other Ambulatory Visit (INDEPENDENT_AMBULATORY_CARE_PROVIDER_SITE_OTHER): Payer: Medicare Other

## 2013-01-16 DIAGNOSIS — E7800 Pure hypercholesterolemia, unspecified: Secondary | ICD-10-CM

## 2013-01-16 DIAGNOSIS — E039 Hypothyroidism, unspecified: Secondary | ICD-10-CM

## 2013-01-16 LAB — LIPID PANEL
HDL: 31.5 mg/dL — ABNORMAL LOW (ref >39.00)
Total CHOL/HDL Ratio: 3
Triglycerides: 69 mg/dL (ref 0.0–149.0)
VLDL: 13.8 mg/dL (ref 0.0–40.0)

## 2013-01-16 LAB — COMPREHENSIVE METABOLIC PANEL
ALT: 13 U/L (ref 0–35)
AST: 25 U/L (ref 0–37)
Alkaline Phosphatase: 60 U/L (ref 39–117)
BUN: 20 mg/dL (ref 6–23)
Calcium: 8.8 mg/dL (ref 8.4–10.5)
Chloride: 104 mEq/L (ref 96–112)
Creatinine, Ser: 1.3 mg/dL — ABNORMAL HIGH (ref 0.4–1.2)
Total Bilirubin: 1 mg/dL (ref 0.3–1.2)

## 2013-01-16 LAB — TSH: TSH: 0.27 u[IU]/mL — ABNORMAL LOW (ref 0.35–5.50)

## 2013-01-18 LAB — CBC CANCER CENTER
Basophil %: 1.3 %
Eosinophil %: 7.5 %
HCT: 42.3 % (ref 35.0–47.0)
Lymphocyte #: 1.6 x10 3/mm (ref 1.0–3.6)
Lymphocyte %: 14.7 %
MCH: 22.1 pg — ABNORMAL LOW (ref 26.0–34.0)
MCHC: 29.2 g/dL — ABNORMAL LOW (ref 32.0–36.0)
Monocyte #: 0.5 x10 3/mm (ref 0.2–0.9)
Neutrophil #: 7.8 x10 3/mm — ABNORMAL HIGH (ref 1.4–6.5)
Neutrophil %: 71.7 %
Platelet: 179 x10 3/mm (ref 150–440)
RBC: 5.56 10*6/uL — ABNORMAL HIGH (ref 3.80–5.20)
RDW: 23 % — ABNORMAL HIGH (ref 11.5–14.5)

## 2013-01-23 ENCOUNTER — Encounter: Payer: Self-pay | Admitting: Family Medicine

## 2013-01-23 ENCOUNTER — Ambulatory Visit (INDEPENDENT_AMBULATORY_CARE_PROVIDER_SITE_OTHER): Payer: Medicare Other | Admitting: Family Medicine

## 2013-01-23 VITALS — BP 130/68 | HR 69 | Temp 97.5°F | Ht 63.5 in | Wt 134.5 lb

## 2013-01-23 DIAGNOSIS — Z0000 Encounter for general adult medical examination without abnormal findings: Secondary | ICD-10-CM

## 2013-01-23 DIAGNOSIS — F329 Major depressive disorder, single episode, unspecified: Secondary | ICD-10-CM

## 2013-01-23 DIAGNOSIS — M810 Age-related osteoporosis without current pathological fracture: Secondary | ICD-10-CM

## 2013-01-23 DIAGNOSIS — E039 Hypothyroidism, unspecified: Secondary | ICD-10-CM

## 2013-01-23 DIAGNOSIS — Z8679 Personal history of other diseases of the circulatory system: Secondary | ICD-10-CM

## 2013-01-23 DIAGNOSIS — F411 Generalized anxiety disorder: Secondary | ICD-10-CM

## 2013-01-23 DIAGNOSIS — I10 Essential (primary) hypertension: Secondary | ICD-10-CM

## 2013-01-23 DIAGNOSIS — E7800 Pure hypercholesterolemia, unspecified: Secondary | ICD-10-CM

## 2013-01-23 NOTE — Progress Notes (Signed)
Pleasant 79 yo here for Medicare Annual Wellness Visit.  I have personally reviewed the Medicare Annual Wellness questionnaire and have noted 1. The patient's medical and social history- son, Jeanet Lupe, is her HPOA.  She is full code. 2. Their use of alcohol, tobacco or illicit drugs 3. Their current medications and supplements 4. The patient's functional ability including ADL's, fall risks, home safety risks and hearing or visual             Impairment.- lives alone, independent for all ADLS. 5. Diet and physical activities- remains active. 6. Evidence for depression or mood disorders  End of life wishes discussed and updated in Social History.  Has been doing well.  Had recurrent UTIs, referred to Dr. Lonna Cobb by renal.  Per pt, cystoscopy negative.  Given doxycyline for prophylaxis.  Depression- doing well off zoloft.  Denies any anxiety or depression.  Loves her puppyEvaristo Bury.  "He has done wonders for my mood."  H/o TIAs- has refused Aggrenox and plavix, I advised ASA 81 mg daily.  HTN- doing well. No side effects on current meds. No HA, blurred vision, LE edema, CP or SOB.     Stage III CKD- followed by Dr Cherylann Ratel recently ( 01/01/2013). She feels well with no complaints. Cr stable. Lab Results  Component Value Date   CREATININE 1.3* 01/16/2013   Polycythemia vera- Last saw hematologist, Dr. Lorre Nick,  Last week, phlebotomy was performed.  HLD- last lipid panel was stable, taking Fish oil.   Lab Results  Component Value Date   CHOL 109 01/16/2013   HDL 31.50* 01/16/2013   LDLCALC 64 01/16/2013   LDLDIRECT 66.9 05/27/2010   TRIG 69.0 01/16/2013   CHOLHDL 3 01/16/2013      Hypothyroidism- Denies any symptoms of hypo or hyperthyroidism.  TSH low, normal Ft4 Lab Results  Component Value Date   TSH 0.27* 01/16/2013   Patient Active Problem List   Diagnosis Date Noted  . Routine general medical examination at a health care facility 11/05/2010  . PULMONARY NODULE, RIGHT UPPER  LOBE 03/13/2008  . CARCINOMA, BASAL CELL, NECK 10/19/2006  . NEOPLASM, SKIN, UNCERTAIN BEHAVIOR 10/12/2006  . HYPERCHOLESTEROLEMIA, PURE 10/10/2006  . HYPOTHYROIDISM 08/15/2006  . OSTEOPOROSIS 08/15/2006  . HYPERTENSION, BENIGN ESSENTIAL 10/24/2005  . ANXIETY 10/07/2005  . DEPRESSION 10/07/2005  . TRANSIENT ISCHEMIC ATTACKS, HX OF 02/06/2005   No past medical history on file. No past surgical history on file. History  Substance Use Topics  . Smoking status: Never Smoker   . Smokeless tobacco: Not on file  . Alcohol Use: Not on file   No family history on file. Allergies  Allergen Reactions  . Penicillins     REACTION: UNSPECIFIED  . Pneumococcal Vaccine Polyvalent   . Ramipril     REACTION: renal insuff  . Sulfonamide Derivatives    Current Outpatient Prescriptions on File Prior to Visit  Medication Sig Dispense Refill  . amLODipine (NORVASC) 10 MG tablet Take 10 mg by mouth daily.       . Calcium Carbonate-Vitamin D (CALCIUM 600/VITAMIN D) 600-400 MG-UNIT per tablet Take 1 tablet by mouth 2 (two) times daily.        Marland Kitchen doxycycline (DORYX) 100 MG DR capsule Take one half tablet daily to prevent bladder infections      . enalapril (VASOTEC) 2.5 MG tablet Take 2.5 mg by mouth daily.       . haloperidol (HALDOL) 0.5 MG tablet TAKE 1 TABLET BY MOUTH TWICE A DAY IN  THE MORNING AND AT DINNER  180 tablet  0  . hydroxyurea (HYDREA) 500 MG capsule Take 500 mg by mouth once a week.       . levothyroxine (SYNTHROID) 75 MCG tablet Take 1 tablet (75 mcg total) by mouth daily.  30 tablet  11  . levothyroxine (SYNTHROID, LEVOTHROID) 100 MCG tablet TAKE 1 TABLET BY MOUTH EVERY DAY  90 tablet  0  . levothyroxine (SYNTHROID, LEVOTHROID) 75 MCG tablet TAKE 1 TABLET EVERY DAY  60 tablet  0  . metoprolol tartrate (LOPRESSOR) 25 MG tablet TAKE 1 & 1/2 TABLETS TWICE A DAY  270 tablet  0  . Nutritional Supplements (JUICE PLUS FIBRE PO) Take 2 capsules by mouth daily.        . Omega-3 Fatty Acids  (FISH OIL) 300 MG CAPS Take 1 capsule by mouth daily.        . Sod Citrate-Citric Acid (CYTRA-2) 500-334 MG/5ML SOLN Take 30 mLs by mouth 2 (two) times daily.        No current facility-administered medications on file prior to visit.    Review of Systems  See HPI  Patient reports no  vision/ hearing changes,anorexia, weight change, fever ,adenopathy, persistant / recurrent hoarseness, swallowing issues, chest pain, edema,persistant / recurrent cough, hemoptysis, dyspnea(rest, exertional, paroxysmal nocturnal), gastrointestinal  bleeding (melena, rectal bleeding), abdominal pain, excessive heart burn, GU symptoms(dysuria, hematuria, pyuria, voiding/incontinence  Issues) syncope, focal weakness, severe memory loss, concerning skin lesions, depression, anxiety, abnormal bruising/bleeding, major joint swelling, breast masses or abnormal vaginal bleeding.     Physical Exam  BP 130/68  Pulse 69  Temp(Src) 97.5 F (36.4 C) (Oral)  Ht 5' 3.5" (1.613 m)  Wt 134 lb 8 oz (61.009 kg)  BMI 23.45 kg/m2  SpO2 98%  General: Well-developed,well-nourished,in no acute distress; alert,appropriate and cooperative throughout examination  Mouth: Oral mucosa and oropharynx without lesions or exudates. Teeth in good repair.  Neck: No deformities, masses, or tenderness noted.  Lungs: Normal respiratory effort, chest expands symmetrically. Lungs are clear to auscultation, no crackles or wheezes.  Heart: Normal rate and regular rhythm. S1 and S2 normal without gallop, murmur, click, rub or other extra sounds.  Extremities: No clubbing, cyanosis, or deformity noted with normal full range of motion of all joints. no significant edema.  Neurologic: No cranial nerve deficits noted. Station and gait are normal. Plantar reflexes are down-going bilaterally. DTRs are symmetrical throughout. Sensory, motor and coordinative functions appear intact.  Psych: Oriented X3, memory intact for recent and remote, normally  interactive, good eye contact, not depressed appearing, and slightly anxious.   Assessment and Plan:  1. Routine general medical examination at a health care facility The patients weight, height, BMI and visual acuity have been recorded in the chart I have made referrals, counseling and provided education to the patient based review of the above and I have provided the pt with a written personalized care plan for preventive services.  Refusing flu shot. 2. HYPOTHYROIDISM TSH high but normal FT4. She is asymptomatic.  No changes today.  3. HYPERTENSION, BENIGN ESSENTIAL Stable on current rx.  No changes .  4. HYPERCHOLESTEROLEMIA, PURE Stable.  No changes.   5. DEPRESSION Well controlled on current dose of Zoloft.  No changes.   6. TRANSIENT ISCHEMIC ATTACKS, HX OF On ASA 81 mg daily. Refused further antiplatelet therapy.

## 2013-01-23 NOTE — Patient Instructions (Addendum)
Your labs looked good but your thyroid was a little off.  If you develop any symptoms that are unusual like racing heart, anxiety, loose stools, please let me know.  For now, we are not making any changes.

## 2013-02-02 ENCOUNTER — Other Ambulatory Visit: Payer: Self-pay | Admitting: Family Medicine

## 2013-02-06 ENCOUNTER — Ambulatory Visit: Payer: Self-pay | Admitting: Internal Medicine

## 2013-02-15 ENCOUNTER — Telehealth: Payer: Self-pay | Admitting: Family Medicine

## 2013-02-15 MED ORDER — LEVOTHYROXINE SODIUM 75 MCG PO TABS: ORAL_TABLET | ORAL | Status: DC

## 2013-02-15 NOTE — Telephone Encounter (Signed)
It could certainly be from her thyroid but there are of course other possibilities.  Let's decrease her synthroid back to 75 mc daily (rx sent to pharmacy) but if symptoms persist, she needs to be evaluated.

## 2013-02-15 NOTE — Telephone Encounter (Signed)
Pt's son was in office today and stated that mom Donna [name] had been SOB and dizzy.  Offered to speak to triage nurse, but son stated that Dr. Dayton Martes wanted to know her status.  Best number to call patient [phone] or cell number [phone] / lt

## 2013-02-15 NOTE — Telephone Encounter (Signed)
Please call son.  I am nursing home and difficult to talk here.  If she is SOB and dizzy, needs to be evaluated either by one of my partners or in ER/urgent care.

## 2013-02-15 NOTE — Telephone Encounter (Signed)
Pt's son returned call.  States that last time pt saw Dr. Dayton Martes, she told her if she started having SOB or dizziness (both upon standing and ambulating) that she would adjust her thyroid medication.  There are no open appts in the office today.  [facility] cb [phone].

## 2013-02-15 NOTE — Telephone Encounter (Signed)
Pt's son informed.  F/U appt scheduled for 02/25/13.

## 2013-02-15 NOTE — Telephone Encounter (Signed)
Called son.  Left message with instructions below and to call us back to schedule appt or with any questions.

## 2013-02-16 ENCOUNTER — Inpatient Hospital Stay: Payer: Self-pay | Admitting: Student

## 2013-02-16 LAB — COMPREHENSIVE METABOLIC PANEL
Alkaline Phosphatase: 80 U/L (ref 50–136)
Anion Gap: 11 (ref 7–16)
Calcium, Total: 8.5 mg/dL (ref 8.5–10.1)
Co2: 21 mmol/L (ref 21–32)
Glucose: 119 mg/dL — ABNORMAL HIGH (ref 65–99)
Osmolality: 270 (ref 275–301)
SGOT(AST): 25 U/L (ref 15–37)
SGPT (ALT): 19 U/L (ref 12–78)
Sodium: 131 mmol/L — ABNORMAL LOW (ref 136–145)
Total Protein: 7.1 g/dL (ref 6.4–8.2)

## 2013-02-16 LAB — CBC
HGB: 11.3 g/dL — ABNORMAL LOW (ref 12.0–16.0)
MCH: 22.6 pg — ABNORMAL LOW (ref 26.0–34.0)
MCV: 74 fL — ABNORMAL LOW (ref 80–100)
Platelet: 159 10*3/uL (ref 150–440)
RBC: 4.99 10*6/uL (ref 3.80–5.20)
WBC: 16.9 10*3/uL — ABNORMAL HIGH (ref 3.6–11.0)

## 2013-02-16 LAB — CK TOTAL AND CKMB (NOT AT ARMC): CK, Total: 38 U/L (ref 21–215)

## 2013-02-17 LAB — CBC WITH DIFFERENTIAL/PLATELET
Basophil #: 0.2 10*3/uL — ABNORMAL HIGH (ref 0.0–0.1)
Eosinophil #: 0.2 10*3/uL (ref 0.0–0.7)
Eosinophil %: 2 %
Lymphocyte #: 1.2 10*3/uL (ref 1.0–3.6)
MCH: 22.7 pg — ABNORMAL LOW (ref 26.0–34.0)
MCV: 74 fL — ABNORMAL LOW (ref 80–100)
Monocyte %: 8.7 %
Neutrophil #: 8.9 10*3/uL — ABNORMAL HIGH (ref 1.4–6.5)
Neutrophil %: 77.2 %

## 2013-02-17 LAB — BASIC METABOLIC PANEL
BUN: 34 mg/dL — ABNORMAL HIGH (ref 7–18)
Calcium, Total: 7.9 mg/dL — ABNORMAL LOW (ref 8.5–10.1)
Chloride: 99 mmol/L (ref 98–107)
Co2: 21 mmol/L (ref 21–32)
Creatinine: 1.99 mg/dL — ABNORMAL HIGH (ref 0.60–1.30)
EGFR (Non-African Amer.): 22 — ABNORMAL LOW
Osmolality: 266 (ref 275–301)

## 2013-02-17 LAB — SODIUM, URINE, RANDOM: Sodium, Urine Random: 18 mmol/L (ref 20–110)

## 2013-02-18 LAB — BASIC METABOLIC PANEL
Anion Gap: 9 (ref 7–16)
BUN: 32 mg/dL — ABNORMAL HIGH (ref 7–18)
Calcium, Total: 7.8 mg/dL — ABNORMAL LOW (ref 8.5–10.1)
Chloride: 100 mmol/L (ref 98–107)
EGFR (Non-African Amer.): 24 — ABNORMAL LOW
Glucose: 107 mg/dL — ABNORMAL HIGH (ref 65–99)
Osmolality: 266 (ref 275–301)
Potassium: 4.6 mmol/L (ref 3.5–5.1)
Sodium: 129 mmol/L — ABNORMAL LOW (ref 136–145)

## 2013-02-19 LAB — BASIC METABOLIC PANEL
Anion Gap: 9 (ref 7–16)
BUN: 28 mg/dL — ABNORMAL HIGH (ref 7–18)
Co2: 18 mmol/L — ABNORMAL LOW (ref 21–32)
Creatinine: 1.75 mg/dL — ABNORMAL HIGH (ref 0.60–1.30)
EGFR (African American): 30 — ABNORMAL LOW
Glucose: 97 mg/dL (ref 65–99)
Potassium: 4.8 mmol/L (ref 3.5–5.1)
Sodium: 131 mmol/L — ABNORMAL LOW (ref 136–145)

## 2013-02-20 LAB — BASIC METABOLIC PANEL
BUN: 29 mg/dL — ABNORMAL HIGH (ref 7–18)
Calcium, Total: 8 mg/dL — ABNORMAL LOW (ref 8.5–10.1)
Chloride: 106 mmol/L (ref 98–107)
EGFR (Non-African Amer.): 25 — ABNORMAL LOW
Osmolality: 270 (ref 275–301)
Potassium: 4.9 mmol/L (ref 3.5–5.1)

## 2013-02-21 ENCOUNTER — Inpatient Hospital Stay: Payer: Self-pay | Admitting: Internal Medicine

## 2013-02-21 LAB — CK TOTAL AND CKMB (NOT AT ARMC)
CK, Total: 45 U/L (ref 21–215)
CK-MB: 3.7 ng/mL — ABNORMAL HIGH (ref 0.5–3.6)
CK-MB: 3.7 ng/mL — ABNORMAL HIGH (ref 0.5–3.6)

## 2013-02-21 LAB — BASIC METABOLIC PANEL
Anion Gap: 8 (ref 7–16)
Calcium, Total: 8.2 mg/dL — ABNORMAL LOW (ref 8.5–10.1)
Chloride: 106 mmol/L (ref 98–107)
Co2: 19 mmol/L — ABNORMAL LOW (ref 21–32)
Creatinine: 1.64 mg/dL — ABNORMAL HIGH (ref 0.60–1.30)
EGFR (African American): 32 — ABNORMAL LOW
EGFR (Non-African Amer.): 28 — ABNORMAL LOW
Glucose: 106 mg/dL — ABNORMAL HIGH (ref 65–99)
Potassium: 4.9 mmol/L (ref 3.5–5.1)
Sodium: 133 mmol/L — ABNORMAL LOW (ref 136–145)

## 2013-02-21 LAB — CBC
HGB: 10.7 g/dL — ABNORMAL LOW (ref 12.0–16.0)
MCH: 22.8 pg — ABNORMAL LOW (ref 26.0–34.0)
MCHC: 30.6 g/dL — ABNORMAL LOW (ref 32.0–36.0)
MCV: 75 fL — ABNORMAL LOW (ref 80–100)
WBC: 10.4 10*3/uL (ref 3.6–11.0)

## 2013-02-21 LAB — TROPONIN I
Troponin-I: <0.02 ng/mL
Troponin-I: <0.02 ng/mL

## 2013-02-21 LAB — MAGNESIUM: Magnesium: 1.9 mg/dL

## 2013-02-21 LAB — CULTURE, BLOOD (SINGLE)

## 2013-02-22 LAB — CBC WITH DIFFERENTIAL/PLATELET
Basophil #: 0 10*3/uL (ref 0.0–0.1)
Basophil %: 0.4 %
Eosinophil #: 0 10*3/uL (ref 0.0–0.7)
Eosinophil %: 0.1 %
HGB: 10.3 g/dL — ABNORMAL LOW (ref 12.0–16.0)
Lymphocyte #: 0.7 10*3/uL — ABNORMAL LOW (ref 1.0–3.6)
MCH: 22.8 pg — ABNORMAL LOW (ref 26.0–34.0)
MCHC: 30.6 g/dL — ABNORMAL LOW (ref 32.0–36.0)
Neutrophil #: 5.8 10*3/uL (ref 1.4–6.5)
Neutrophil %: 87.4 %
WBC: 6.6 10*3/uL (ref 3.6–11.0)

## 2013-02-22 LAB — CK TOTAL AND CKMB (NOT AT ARMC): CK-MB: 3.2 ng/mL (ref 0.5–3.6)

## 2013-02-22 LAB — COMPREHENSIVE METABOLIC PANEL
Albumin: 2.6 g/dL — ABNORMAL LOW (ref 3.4–5.0)
Alkaline Phosphatase: 69 U/L (ref 50–136)
BUN: 34 mg/dL — ABNORMAL HIGH (ref 7–18)
Calcium, Total: 8.4 mg/dL — ABNORMAL LOW (ref 8.5–10.1)
Chloride: 104 mmol/L (ref 98–107)
Creatinine: 1.86 mg/dL — ABNORMAL HIGH (ref 0.60–1.30)
SGPT (ALT): 17 U/L (ref 12–78)
Sodium: 132 mmol/L — ABNORMAL LOW (ref 136–145)

## 2013-02-22 LAB — PROTIME-INR: INR: 1.1

## 2013-02-22 LAB — LIPID PANEL
HDL Cholesterol: 23 mg/dL — ABNORMAL LOW (ref 40–60)
Ldl Cholesterol, Calc: 63 mg/dL (ref 0–100)
VLDL Cholesterol, Calc: 19 mg/dL (ref 5–40)

## 2013-02-23 DIAGNOSIS — I499 Cardiac arrhythmia, unspecified: Secondary | ICD-10-CM

## 2013-02-23 LAB — BASIC METABOLIC PANEL
Anion Gap: 12 (ref 7–16)
Chloride: 104 mmol/L (ref 98–107)
Co2: 18 mmol/L — ABNORMAL LOW (ref 21–32)
EGFR (African American): 26 — ABNORMAL LOW
Sodium: 134 mmol/L — ABNORMAL LOW (ref 136–145)

## 2013-02-24 LAB — BASIC METABOLIC PANEL
Anion Gap: 10 (ref 7–16)
BUN: 47 mg/dL — ABNORMAL HIGH (ref 7–18)
Calcium, Total: 8.2 mg/dL — ABNORMAL LOW (ref 8.5–10.1)
Chloride: 103 mmol/L (ref 98–107)
Co2: 19 mmol/L — ABNORMAL LOW (ref 21–32)
Creatinine: 1.98 mg/dL — ABNORMAL HIGH (ref 0.60–1.30)
EGFR (African American): 26 — ABNORMAL LOW
Glucose: 106 mg/dL — ABNORMAL HIGH (ref 65–99)
Osmolality: 277 (ref 275–301)
Potassium: 4.3 mmol/L (ref 3.5–5.1)
Sodium: 132 mmol/L — ABNORMAL LOW (ref 136–145)

## 2013-02-25 ENCOUNTER — Ambulatory Visit: Payer: Medicare Other | Admitting: Family Medicine

## 2013-02-25 LAB — BASIC METABOLIC PANEL
Anion Gap: 8 (ref 7–16)
Chloride: 107 mmol/L (ref 98–107)
EGFR (African American): 29 — ABNORMAL LOW
EGFR (Non-African Amer.): 25 — ABNORMAL LOW
Glucose: 89 mg/dL (ref 65–99)
Osmolality: 280 (ref 275–301)
Potassium: 4.2 mmol/L (ref 3.5–5.1)
Sodium: 135 mmol/L — ABNORMAL LOW (ref 136–145)

## 2013-02-25 LAB — CBC WITH DIFFERENTIAL/PLATELET
Bands: 2 %
Basophil: 1 %
Eosinophil: 5 %
HGB: 11.7 g/dL — ABNORMAL LOW (ref 12.0–16.0)
MCHC: 30.1 g/dL — ABNORMAL LOW (ref 32.0–36.0)
MCV: 74 fL — ABNORMAL LOW (ref 80–100)
Monocytes: 4 %
NRBC/100 WBC: 3 /
Platelet: 172 10*3/uL (ref 150–440)
RBC: 5.24 10*6/uL — ABNORMAL HIGH (ref 3.80–5.20)
RDW: 23.2 % — ABNORMAL HIGH (ref 11.5–14.5)
Segmented Neutrophils: 78 %
WBC: 15.2 10*3/uL — ABNORMAL HIGH (ref 3.6–11.0)

## 2013-02-25 LAB — PROTIME-INR
INR: 1.1
Prothrombin Time: 14.7 secs (ref 11.5–14.7)

## 2013-02-25 LAB — APTT: Activated PTT: 33.6 secs (ref 23.6–35.9)

## 2013-02-27 ENCOUNTER — Ambulatory Visit: Payer: Self-pay | Admitting: Internal Medicine

## 2013-02-27 ENCOUNTER — Encounter: Payer: Self-pay | Admitting: Internal Medicine

## 2013-03-04 ENCOUNTER — Telehealth: Payer: Self-pay

## 2013-03-04 NOTE — Telephone Encounter (Signed)
Pam pharmacist at [facility] said pt was admitted for rehab and pt is presently taking Haldol. Pam said she will have elderly psyciatric service do and evaluation to see if pt needs to continue Haldol.

## 2013-03-09 ENCOUNTER — Encounter: Payer: Self-pay | Admitting: Internal Medicine

## 2013-03-14 ENCOUNTER — Ambulatory Visit: Payer: Self-pay | Admitting: Internal Medicine

## 2013-03-29 LAB — CBC CANCER CENTER
Eosinophil %: 6 %
HCT: 39.7 % (ref 35.0–47.0)
Lymphocyte %: 14.5 %
MCHC: 28.9 g/dL — ABNORMAL LOW (ref 32.0–36.0)
MCV: 77 fL — ABNORMAL LOW (ref 80–100)
Monocyte #: 0.6 x10 3/mm (ref 0.2–0.9)
Neutrophil #: 7.6 x10 3/mm — ABNORMAL HIGH (ref 1.4–6.5)
Neutrophil %: 72.2 %
Platelet: 191 x10 3/mm (ref 150–440)
WBC: 10.6 x10 3/mm (ref 3.6–11.0)

## 2013-04-08 ENCOUNTER — Ambulatory Visit: Payer: Self-pay | Admitting: Internal Medicine

## 2013-04-29 ENCOUNTER — Telehealth: Payer: Self-pay

## 2013-04-29 NOTE — Telephone Encounter (Signed)
Bynum said pt received call from [facility] about a needed appt; not sure who called pt. Bynum spoke with pts mother and was an automated call about flu shot; Bynum said pt had only taken one flu shot and was deathly sick and has never taken another flu shot.

## 2013-05-05 ENCOUNTER — Other Ambulatory Visit: Payer: Self-pay | Admitting: Family Medicine

## 2013-05-06 NOTE — Telephone Encounter (Signed)
Spoke to pt and informed her that ov is needed for additional refills after this one is sent. States that she will have her son call as it must work with his personal schedule.

## 2013-05-17 ENCOUNTER — Ambulatory Visit: Payer: Self-pay | Admitting: Internal Medicine

## 2013-05-17 LAB — CANCER CENTER HEMATOCRIT: HCT: 43 % (ref 35.0–47.0)

## 2013-05-24 LAB — BASIC METABOLIC PANEL
Anion Gap: 7 (ref 7–16)
BUN: 29 mg/dL — ABNORMAL HIGH (ref 7–18)
CO2: 24 mmol/L (ref 21–32)
Calcium, Total: 8.3 mg/dL — ABNORMAL LOW (ref 8.5–10.1)
Chloride: 84 mmol/L — ABNORMAL LOW (ref 98–107)
Creatinine: 1.71 mg/dL — ABNORMAL HIGH (ref 0.60–1.30)
EGFR (African American): 30 — ABNORMAL LOW
EGFR (Non-African Amer.): 26 — ABNORMAL LOW
Glucose: 102 mg/dL — ABNORMAL HIGH (ref 65–99)
Osmolality: 239 (ref 275–301)
Potassium: 4.5 mmol/L (ref 3.5–5.1)
SODIUM: 115 mmol/L — AB (ref 136–145)

## 2013-05-24 LAB — URINALYSIS, COMPLETE
BILIRUBIN, UR: NEGATIVE
BLOOD: NEGATIVE
Bacteria: NONE SEEN
GLUCOSE, UR: NEGATIVE mg/dL (ref 0–75)
KETONE: NEGATIVE
Leukocyte Esterase: NEGATIVE
Nitrite: NEGATIVE
Ph: 8 (ref 4.5–8.0)
RBC,UR: <1 /HPF (ref 0–5)
SPECIFIC GRAVITY: 1.006 (ref 1.003–1.030)
SQUAMOUS EPITHELIAL: NONE SEEN

## 2013-05-24 LAB — CBC
HCT: 37.2 % (ref 35.0–47.0)
HGB: 11.4 g/dL — AB (ref 12.0–16.0)
MCH: 21.5 pg — ABNORMAL LOW (ref 26.0–34.0)
MCHC: 30.8 g/dL — ABNORMAL LOW (ref 32.0–36.0)
MCV: 70 fL — ABNORMAL LOW (ref 80–100)
Platelet: 179 10*3/uL (ref 150–440)
RBC: 5.32 10*6/uL — AB (ref 3.80–5.20)
RDW: 23.3 % — ABNORMAL HIGH (ref 11.5–14.5)
WBC: 11.2 10*3/uL — ABNORMAL HIGH (ref 3.6–11.0)

## 2013-05-25 ENCOUNTER — Inpatient Hospital Stay: Payer: Self-pay | Admitting: Orthopedic Surgery

## 2013-05-25 LAB — BASIC METABOLIC PANEL
ANION GAP: 8 (ref 7–16)
Anion Gap: 10 (ref 7–16)
BUN: 32 mg/dL — ABNORMAL HIGH (ref 7–18)
BUN: 32 mg/dL — AB (ref 7–18)
CHLORIDE: 84 mmol/L — AB (ref 98–107)
CHLORIDE: 85 mmol/L — AB (ref 98–107)
CO2: 24 mmol/L (ref 21–32)
CREATININE: 1.78 mg/dL — AB (ref 0.60–1.30)
Calcium, Total: 7.9 mg/dL — ABNORMAL LOW (ref 8.5–10.1)
Calcium, Total: 8.1 mg/dL — ABNORMAL LOW (ref 8.5–10.1)
Co2: 21 mmol/L (ref 21–32)
Creatinine: 1.64 mg/dL — ABNORMAL HIGH (ref 0.60–1.30)
EGFR (African American): 29 — ABNORMAL LOW
EGFR (African American): 32 — ABNORMAL LOW
EGFR (Non-African Amer.): 25 — ABNORMAL LOW
EGFR (Non-African Amer.): 28 — ABNORMAL LOW
GLUCOSE: 103 mg/dL — AB (ref 65–99)
Glucose: 117 mg/dL — ABNORMAL HIGH (ref 65–99)
OSMOLALITY: 243 (ref 275–301)
Osmolality: 242 (ref 275–301)
POTASSIUM: 4.3 mmol/L (ref 3.5–5.1)
Potassium: 4.3 mmol/L (ref 3.5–5.1)
SODIUM: 116 mmol/L — AB (ref 136–145)
Sodium: 116 mmol/L — CL (ref 136–145)

## 2013-05-25 LAB — SODIUM
Sodium: 115 mmol/L — CL (ref 136–145)
Sodium: 120 mmol/L — CL (ref 136–145)

## 2013-05-25 LAB — TSH: THYROID STIMULATING HORM: 8.09 u[IU]/mL — AB

## 2013-05-26 LAB — BASIC METABOLIC PANEL
Anion Gap: 8 (ref 7–16)
BUN: 27 mg/dL — ABNORMAL HIGH (ref 7–18)
CHLORIDE: 94 mmol/L — AB (ref 98–107)
CO2: 21 mmol/L (ref 21–32)
Calcium, Total: 7.8 mg/dL — ABNORMAL LOW (ref 8.5–10.1)
Creatinine: 1.63 mg/dL — ABNORMAL HIGH (ref 0.60–1.30)
EGFR (African American): 32 — ABNORMAL LOW
EGFR (Non-African Amer.): 28 — ABNORMAL LOW
Glucose: 98 mg/dL (ref 65–99)
Osmolality: 253 (ref 275–301)
POTASSIUM: 4.3 mmol/L (ref 3.5–5.1)
Sodium: 123 mmol/L — ABNORMAL LOW (ref 136–145)

## 2013-05-26 LAB — SODIUM: Sodium: 122 mmol/L — ABNORMAL LOW (ref 136–145)

## 2013-05-27 LAB — BASIC METABOLIC PANEL
ANION GAP: 8 (ref 7–16)
BUN: 30 mg/dL — AB (ref 7–18)
CALCIUM: 8.1 mg/dL — AB (ref 8.5–10.1)
Chloride: 96 mmol/L — ABNORMAL LOW (ref 98–107)
Co2: 21 mmol/L (ref 21–32)
Creatinine: 2.07 mg/dL — ABNORMAL HIGH (ref 0.60–1.30)
EGFR (African American): 24 — ABNORMAL LOW
EGFR (Non-African Amer.): 21 — ABNORMAL LOW
Glucose: 88 mg/dL (ref 65–99)
OSMOLALITY: 257 (ref 275–301)
POTASSIUM: 4.3 mmol/L (ref 3.5–5.1)
Sodium: 125 mmol/L — ABNORMAL LOW (ref 136–145)

## 2013-05-27 LAB — SODIUM
Sodium: 124 mmol/L — ABNORMAL LOW (ref 136–145)
Sodium: 124 mmol/L — ABNORMAL LOW (ref 136–145)

## 2013-05-28 LAB — BASIC METABOLIC PANEL
Anion Gap: 6 — ABNORMAL LOW (ref 7–16)
BUN: 37 mg/dL — ABNORMAL HIGH (ref 7–18)
CALCIUM: 8.1 mg/dL — AB (ref 8.5–10.1)
CREATININE: 2.09 mg/dL — AB (ref 0.60–1.30)
Chloride: 96 mmol/L — ABNORMAL LOW (ref 98–107)
Co2: 21 mmol/L (ref 21–32)
EGFR (African American): 24 — ABNORMAL LOW
EGFR (Non-African Amer.): 21 — ABNORMAL LOW
Glucose: 95 mg/dL (ref 65–99)
OSMOLALITY: 256 (ref 275–301)
Potassium: 4.9 mmol/L (ref 3.5–5.1)
SODIUM: 123 mmol/L — AB (ref 136–145)

## 2013-05-28 LAB — SODIUM
SODIUM: 124 mmol/L — AB (ref 136–145)
SODIUM: 126 mmol/L — AB (ref 136–145)
SODIUM: 127 mmol/L — AB (ref 136–145)
Sodium: 124 mmol/L — ABNORMAL LOW (ref 136–145)
Sodium: 130 mmol/L — ABNORMAL LOW (ref 136–145)

## 2013-05-28 LAB — OSMOLALITY: Osmolality: 274 mOsm/kg — ABNORMAL LOW (ref 280–301)

## 2013-05-28 LAB — URIC ACID: Uric Acid: 8.3 mg/dL — ABNORMAL HIGH (ref 2.6–6.0)

## 2013-05-29 LAB — SODIUM
SODIUM: 129 mmol/L — AB (ref 136–145)
Sodium: 126 mmol/L — ABNORMAL LOW (ref 136–145)
Sodium: 129 mmol/L — ABNORMAL LOW (ref 136–145)
Sodium: 132 mmol/L — ABNORMAL LOW (ref 136–145)

## 2013-05-29 LAB — BASIC METABOLIC PANEL
Anion Gap: 6 — ABNORMAL LOW (ref 7–16)
BUN: 42 mg/dL — ABNORMAL HIGH (ref 7–18)
Calcium, Total: 8 mg/dL — ABNORMAL LOW (ref 8.5–10.1)
Chloride: 99 mmol/L (ref 98–107)
Co2: 23 mmol/L (ref 21–32)
Creatinine: 1.95 mg/dL — ABNORMAL HIGH (ref 0.60–1.30)
GFR CALC AF AMER: 26 — AB
GFR CALC NON AF AMER: 22 — AB
GLUCOSE: 99 mg/dL (ref 65–99)
Osmolality: 268 (ref 275–301)
Potassium: 5.2 mmol/L — ABNORMAL HIGH (ref 3.5–5.1)
Sodium: 128 mmol/L — ABNORMAL LOW (ref 136–145)

## 2013-05-29 LAB — OSMOLALITY, URINE: OSMOLALITY: 286 mosm/kg

## 2013-05-29 LAB — SODIUM, URINE, RANDOM: Sodium, Urine Random: 34 mmol/L (ref 20–110)

## 2013-05-30 LAB — SODIUM
SODIUM: 130 mmol/L — AB (ref 136–145)
Sodium: 130 mmol/L — ABNORMAL LOW (ref 136–145)

## 2013-05-31 LAB — BASIC METABOLIC PANEL
Anion Gap: 8 (ref 7–16)
BUN: 48 mg/dL — ABNORMAL HIGH (ref 7–18)
CHLORIDE: 102 mmol/L (ref 98–107)
CREATININE: 1.92 mg/dL — AB (ref 0.60–1.30)
Calcium, Total: 7.9 mg/dL — ABNORMAL LOW (ref 8.5–10.1)
Co2: 20 mmol/L — ABNORMAL LOW (ref 21–32)
EGFR (African American): 26 — ABNORMAL LOW
EGFR (Non-African Amer.): 23 — ABNORMAL LOW
GLUCOSE: 103 mg/dL — AB (ref 65–99)
Osmolality: 274 (ref 275–301)
POTASSIUM: 5.1 mmol/L (ref 3.5–5.1)
SODIUM: 130 mmol/L — AB (ref 136–145)

## 2013-06-04 DIAGNOSIS — S5290XA Unspecified fracture of unspecified forearm, initial encounter for closed fracture: Secondary | ICD-10-CM

## 2013-06-04 DIAGNOSIS — I5022 Chronic systolic (congestive) heart failure: Secondary | ICD-10-CM

## 2013-06-04 DIAGNOSIS — I10 Essential (primary) hypertension: Secondary | ICD-10-CM

## 2013-06-04 DIAGNOSIS — E871 Hypo-osmolality and hyponatremia: Secondary | ICD-10-CM

## 2013-06-09 ENCOUNTER — Ambulatory Visit: Payer: Self-pay | Admitting: Internal Medicine

## 2013-06-27 LAB — CBC CANCER CENTER
BASOS ABS: 0 x10 3/mm (ref 0.0–0.1)
Basophil %: 0.2 %
Eosinophil #: 1 x10 3/mm — ABNORMAL HIGH (ref 0.0–0.7)
Eosinophil %: 11.4 %
HCT: 39.5 % (ref 35.0–47.0)
HGB: 11.5 g/dL — AB (ref 12.0–16.0)
LYMPHS PCT: 12.1 %
Lymphocyte #: 1.1 x10 3/mm (ref 1.0–3.6)
MCH: 22 pg — AB (ref 26.0–34.0)
MCHC: 29.1 g/dL — ABNORMAL LOW (ref 32.0–36.0)
MCV: 76 fL — AB (ref 80–100)
MONO ABS: 0.6 x10 3/mm (ref 0.2–0.9)
MONOS PCT: 6.1 %
Neutrophil #: 6.3 x10 3/mm (ref 1.4–6.5)
Neutrophil %: 70.2 %
Platelet: 204 x10 3/mm (ref 150–440)
RBC: 5.22 10*6/uL — ABNORMAL HIGH (ref 3.80–5.20)
RDW: 24 % — ABNORMAL HIGH (ref 11.5–14.5)
WBC: 9.1 x10 3/mm (ref 3.6–11.0)

## 2013-06-30 ENCOUNTER — Other Ambulatory Visit: Payer: Self-pay | Admitting: Internal Medicine

## 2013-06-30 ENCOUNTER — Other Ambulatory Visit: Payer: Self-pay | Admitting: Family Medicine

## 2013-07-01 NOTE — Telephone Encounter (Signed)
Last office visit 01/23/2013.  Ok to refill?

## 2013-07-01 NOTE — Telephone Encounter (Signed)
Spoke to pt and informed her Rx has been faxed to requested pharmacy 

## 2013-07-03 ENCOUNTER — Other Ambulatory Visit: Payer: Self-pay | Admitting: Internal Medicine

## 2013-07-03 ENCOUNTER — Other Ambulatory Visit: Payer: Self-pay

## 2013-07-03 MED ORDER — LEVOTHYROXINE SODIUM 75 MCG PO TABS: ORAL_TABLET | ORAL | Status: DC

## 2013-07-03 NOTE — Telephone Encounter (Signed)
Pt son,Bynum left v/m that pt was recently discharged from [facility] refill levothyroxine to [facility]. Pt will be out of med in 1 day. Please advise.

## 2013-07-07 ENCOUNTER — Ambulatory Visit: Payer: Self-pay | Admitting: Internal Medicine

## 2013-08-04 ENCOUNTER — Other Ambulatory Visit: Payer: Self-pay | Admitting: Family Medicine

## 2013-08-08 ENCOUNTER — Ambulatory Visit: Payer: Self-pay | Admitting: Internal Medicine

## 2013-08-09 LAB — CBC CANCER CENTER
Basophil #: 0.5 x10 3/mm — ABNORMAL HIGH (ref 0.0–0.1)
Basophil %: 4.6 %
Eosinophil #: 1 x10 3/mm — ABNORMAL HIGH (ref 0.0–0.7)
Eosinophil %: 8.6 %
HCT: 39.9 % (ref 35.0–47.0)
HGB: 11.2 g/dL — ABNORMAL LOW (ref 12.0–16.0)
Lymphocyte #: 1.6 x10 3/mm (ref 1.0–3.6)
Lymphocyte %: 13.6 %
MCH: 21.3 pg — AB (ref 26.0–34.0)
MCHC: 28 g/dL — ABNORMAL LOW (ref 32.0–36.0)
MCV: 76 fL — ABNORMAL LOW (ref 80–100)
MONOS PCT: 4.7 %
Monocyte #: 0.5 x10 3/mm (ref 0.2–0.9)
NEUTROS PCT: 68.5 %
Neutrophil #: 7.8 x10 3/mm — ABNORMAL HIGH (ref 1.4–6.5)
Platelet: 191 x10 3/mm (ref 150–440)
RBC: 5.24 10*6/uL — ABNORMAL HIGH (ref 3.80–5.20)
RDW: 23.4 % — ABNORMAL HIGH (ref 11.5–14.5)
WBC: 11.4 x10 3/mm — ABNORMAL HIGH (ref 3.6–11.0)

## 2013-09-06 ENCOUNTER — Ambulatory Visit: Payer: Self-pay | Admitting: Internal Medicine

## 2013-09-06 LAB — CBC CANCER CENTER
Basophil #: 0.2 x10 3/mm — ABNORMAL HIGH (ref 0.0–0.1)
Basophil %: 1.5 %
EOS ABS: 1.6 x10 3/mm — AB (ref 0.0–0.7)
EOS PCT: 12.4 %
HCT: 42.4 % (ref 35.0–47.0)
HGB: 12.6 g/dL (ref 12.0–16.0)
LYMPHS PCT: 16.7 %
Lymphocyte #: 2.1 x10 3/mm (ref 1.0–3.6)
MCH: 21.6 pg — ABNORMAL LOW (ref 26.0–34.0)
MCHC: 29.8 g/dL — AB (ref 32.0–36.0)
MCV: 73 fL — ABNORMAL LOW (ref 80–100)
Monocyte #: 0.4 x10 3/mm (ref 0.2–0.9)
Monocyte %: 3 %
NEUTROS ABS: 8.4 x10 3/mm — AB (ref 1.4–6.5)
Neutrophil %: 66.4 %
Platelet: 176 x10 3/mm (ref 150–440)
RBC: 5.85 10*6/uL — AB (ref 3.80–5.20)
RDW: 22.9 % — ABNORMAL HIGH (ref 11.5–14.5)
WBC: 12.6 x10 3/mm — AB (ref 3.6–11.0)

## 2013-09-08 ENCOUNTER — Other Ambulatory Visit: Payer: Self-pay | Admitting: Family Medicine

## 2013-09-23 LAB — CANCER CENTER HEMATOCRIT: HCT: 40.1 % (ref 35.0–47.0)

## 2013-09-30 ENCOUNTER — Other Ambulatory Visit: Payer: Self-pay | Admitting: Family Medicine

## 2013-10-07 ENCOUNTER — Ambulatory Visit: Payer: Self-pay | Admitting: Internal Medicine

## 2013-10-14 LAB — CBC CANCER CENTER
Basophil #: 0.2 x10 3/mm — ABNORMAL HIGH (ref 0.0–0.1)
Basophil %: 1.8 %
Eosinophil #: 1.2 x10 3/mm — ABNORMAL HIGH (ref 0.0–0.7)
Eosinophil %: 11.2 %
HCT: 43.8 % (ref 35.0–47.0)
HGB: 12.7 g/dL (ref 12.0–16.0)
LYMPHS ABS: 1.6 x10 3/mm (ref 1.0–3.6)
LYMPHS PCT: 14.3 %
MCH: 21.3 pg — ABNORMAL LOW (ref 26.0–34.0)
MCHC: 28.9 g/dL — ABNORMAL LOW (ref 32.0–36.0)
MCV: 74 fL — ABNORMAL LOW (ref 80–100)
MONOS PCT: 4.4 %
Monocyte #: 0.5 x10 3/mm (ref 0.2–0.9)
NEUTROS ABS: 7.4 x10 3/mm — AB (ref 1.4–6.5)
Neutrophil %: 68.3 %
PLATELETS: 154 x10 3/mm (ref 150–440)
RBC: 5.94 10*6/uL — ABNORMAL HIGH (ref 3.80–5.20)
RDW: 22.9 % — AB (ref 11.5–14.5)
WBC: 10.9 x10 3/mm (ref 3.6–11.0)

## 2013-10-26 ENCOUNTER — Other Ambulatory Visit: Payer: Self-pay | Admitting: Family Medicine

## 2013-11-04 LAB — CBC CANCER CENTER
BASOS ABS: 0.4 x10 3/mm — AB (ref 0.0–0.1)
BASOS PCT: 3.2 %
Eosinophil #: 1.7 x10 3/mm — ABNORMAL HIGH (ref 0.0–0.7)
Eosinophil %: 12.5 %
HCT: 41.7 % (ref 35.0–47.0)
HGB: 12.1 g/dL (ref 12.0–16.0)
Lymphocyte #: 1.6 x10 3/mm (ref 1.0–3.6)
Lymphocyte %: 11.4 %
MCH: 21 pg — ABNORMAL LOW (ref 26.0–34.0)
MCHC: 29 g/dL — ABNORMAL LOW (ref 32.0–36.0)
MCV: 72 fL — ABNORMAL LOW (ref 80–100)
MONO ABS: 0.5 x10 3/mm (ref 0.2–0.9)
MONOS PCT: 4 %
Neutrophil #: 9.5 x10 3/mm — ABNORMAL HIGH (ref 1.4–6.5)
Neutrophil %: 68.9 %
PLATELETS: 169 x10 3/mm (ref 150–440)
RBC: 5.76 10*6/uL — ABNORMAL HIGH (ref 3.80–5.20)
RDW: 23 % — ABNORMAL HIGH (ref 11.5–14.5)
WBC: 13.8 x10 3/mm — ABNORMAL HIGH (ref 3.6–11.0)

## 2013-11-06 ENCOUNTER — Ambulatory Visit: Payer: Self-pay | Admitting: Internal Medicine

## 2013-11-26 ENCOUNTER — Other Ambulatory Visit: Payer: Self-pay | Admitting: Family Medicine

## 2013-12-02 LAB — CBC CANCER CENTER
Basophil #: 0.5 x10 3/mm — ABNORMAL HIGH (ref 0.0–0.1)
Basophil %: 3.9 %
EOS ABS: 1.6 x10 3/mm — AB (ref 0.0–0.7)
EOS PCT: 13.3 %
HCT: 43 % (ref 35.0–47.0)
HGB: 12.3 g/dL (ref 12.0–16.0)
LYMPHS ABS: 1.5 x10 3/mm (ref 1.0–3.6)
Lymphocyte %: 12.8 %
MCH: 21.1 pg — ABNORMAL LOW (ref 26.0–34.0)
MCHC: 28.5 g/dL — ABNORMAL LOW (ref 32.0–36.0)
MCV: 74 fL — AB (ref 80–100)
MONO ABS: 0.4 x10 3/mm (ref 0.2–0.9)
Monocyte %: 3.7 %
NEUTROS ABS: 7.9 x10 3/mm — AB (ref 1.4–6.5)
NEUTROS PCT: 66.3 %
PLATELETS: 147 x10 3/mm — AB (ref 150–440)
RBC: 5.81 10*6/uL — AB (ref 3.80–5.20)
RDW: 24.7 % — ABNORMAL HIGH (ref 11.5–14.5)
WBC: 11.9 x10 3/mm — AB (ref 3.6–11.0)

## 2013-12-07 ENCOUNTER — Ambulatory Visit: Payer: Self-pay | Admitting: Internal Medicine

## 2013-12-13 ENCOUNTER — Ambulatory Visit (INDEPENDENT_AMBULATORY_CARE_PROVIDER_SITE_OTHER): Payer: Medicare Other | Admitting: Family Medicine

## 2013-12-13 ENCOUNTER — Encounter: Payer: Self-pay | Admitting: Family Medicine

## 2013-12-13 VITALS — BP 126/62 | HR 67 | Temp 97.6°F | Ht 62.75 in | Wt 135.0 lb

## 2013-12-13 DIAGNOSIS — E039 Hypothyroidism, unspecified: Secondary | ICD-10-CM

## 2013-12-13 DIAGNOSIS — D485 Neoplasm of uncertain behavior of skin: Secondary | ICD-10-CM

## 2013-12-13 DIAGNOSIS — N390 Urinary tract infection, site not specified: Secondary | ICD-10-CM

## 2013-12-13 DIAGNOSIS — R0602 Shortness of breath: Secondary | ICD-10-CM

## 2013-12-13 DIAGNOSIS — I10 Essential (primary) hypertension: Secondary | ICD-10-CM

## 2013-12-13 DIAGNOSIS — E7800 Pure hypercholesterolemia, unspecified: Secondary | ICD-10-CM

## 2013-12-13 LAB — CBC WITH DIFFERENTIAL/PLATELET
Basophils Absolute: 0.2 10*3/uL — ABNORMAL HIGH (ref 0.0–0.1)
Basophils Relative: 1.7 % (ref 0.0–3.0)
Eosinophils Absolute: 1.3 10*3/uL — ABNORMAL HIGH (ref 0.0–0.7)
Eosinophils Relative: 11.2 % — ABNORMAL HIGH (ref 0.0–5.0)
HCT: 43.2 % (ref 36.0–46.0)
Hemoglobin: 12.8 g/dL (ref 12.0–15.0)
Lymphocytes Relative: 14.1 % (ref 12.0–46.0)
Lymphs Abs: 1.6 10*3/uL (ref 0.7–4.0)
MCHC: 29.6 g/dL — ABNORMAL LOW (ref 30.0–36.0)
MCV: 73.8 fl — ABNORMAL LOW (ref 78.0–100.0)
MONO ABS: 0.5 10*3/uL (ref 0.1–1.0)
MONOS PCT: 4.5 % (ref 3.0–12.0)
NEUTROS PCT: 68.5 % (ref 43.0–77.0)
Neutro Abs: 7.7 10*3/uL (ref 1.4–7.7)
PLATELETS: 180 10*3/uL (ref 150.0–400.0)
RBC: 5.85 Mil/uL — ABNORMAL HIGH (ref 3.87–5.11)
RDW: 24.3 % — ABNORMAL HIGH (ref 11.5–15.5)
WBC: 11.3 10*3/uL — ABNORMAL HIGH (ref 4.0–10.5)

## 2013-12-13 LAB — COMPREHENSIVE METABOLIC PANEL
ALT: 14 U/L (ref 0–35)
AST: 25 U/L (ref 0–37)
Albumin: 3.7 g/dL (ref 3.5–5.2)
Alkaline Phosphatase: 61 U/L (ref 39–117)
BILIRUBIN TOTAL: 1 mg/dL (ref 0.2–1.2)
BUN: 36 mg/dL — ABNORMAL HIGH (ref 6–23)
CO2: 28 mEq/L (ref 19–32)
Calcium: 9 mg/dL (ref 8.4–10.5)
Chloride: 98 mEq/L (ref 96–112)
Creatinine, Ser: 1.9 mg/dL — ABNORMAL HIGH (ref 0.4–1.2)
GFR: 27.08 mL/min — ABNORMAL LOW (ref >60.00)
Glucose, Bld: 78 mg/dL (ref 70–99)
Potassium: 4.6 mEq/L (ref 3.5–5.1)
Sodium: 132 mEq/L — ABNORMAL LOW (ref 135–145)
Total Protein: 7.1 g/dL (ref 6.0–8.3)

## 2013-12-13 LAB — LIPID PANEL
Cholesterol: 91 mg/dL (ref 0–200)
HDL: 31.6 mg/dL — ABNORMAL LOW (ref >39.00)
LDL CALC: 50 mg/dL (ref 0–99)
NONHDL: 59.4
Total CHOL/HDL Ratio: 3
Triglycerides: 45 mg/dL (ref 0.0–149.0)
VLDL: 9 mg/dL (ref 0.0–40.0)

## 2013-12-13 LAB — T4, FREE: FREE T4: 1.02 ng/dL (ref 0.60–1.60)

## 2013-12-13 LAB — TSH: TSH: 8.59 u[IU]/mL — ABNORMAL HIGH (ref 0.35–4.50)

## 2013-12-13 NOTE — Assessment & Plan Note (Signed)
Overdue to recheck labs. Continue synthroid, check labs today

## 2013-12-13 NOTE — Assessment & Plan Note (Signed)
Taking fish oil. Recheck labs today.

## 2013-12-13 NOTE — Progress Notes (Signed)
Pre visit review using our clinic review tool, if applicable. No additional management support is needed unless otherwise documented below in the visit note. 

## 2013-12-13 NOTE — Assessment & Plan Note (Signed)
On Keflex for prophylaxis Refer to another urologist per pt request.

## 2013-12-13 NOTE — Assessment & Plan Note (Signed)
Has appt with derm I strongly urged her to keep this app.

## 2013-12-13 NOTE — Progress Notes (Signed)
Pleasant 80 yo female here with her son to discuss her rx.   He would like to "wean her off anything she does not need."  Has not been seen by me since 01/2013.    Had pace maker placed on 02/26/14. Fell in 05/2013- broke her right wrist and had to have surgically repaired. Was at [facility] for rehab for 21 days. Now lives with son.  Still has her puppy.  Has been doing well.  Had recurrent UTIs, was seeing Dr. Elenor Quinones but he left practice in [place].    Per pt, cystoscopy negative.  Given keflex for prophylaxis.  They need referral to another uorlogy.    Depression- doing better since she moved in with her son. Denies any anxiety or depression.  Loves her puppyTempie Donning.  "He has done wonders for my mood." Weight improved- drinking Ensure daily.  Weight was down to 115 when she was in SNF. Wt Readings from Last 3 Encounters:  12/13/13 135 lb (61.236 kg)  01/23/13 134 lb 8 oz (61.009 kg)  11/08/11 144 lb (65.318 kg)     H/o TIAs- has refused Aggrenox and plavix, I advised ASA 81 mg daily.  HTN- doing well. No side effects on current meds. No HA, blurred vision, LE edema, CP or SOB.     Stage III CKD- followed by Dr Holley Raring recently ( 01/01/2013). She feels well with no complaints. Cr stable. Lab Results  Component Value Date   CREATININE 1.3* 01/16/2013   Polycythemia vera- Last saw hematologist, Dr. Inez Pilgrim last month.  HLD-  Fish oil.   Lab Results  Component Value Date   CHOL 109 01/16/2013   HDL 31.50* 01/16/2013   LDLCALC 64 01/16/2013   LDLDIRECT 66.9 05/27/2010   TRIG 69.0 01/16/2013   CHOLHDL 3 01/16/2013    Hypothyroidism- Denies any symptoms of hypo or hyperthyroidism.  TSH low, normal Ft4 Lab Results  Component Value Date   TSH 0.27* 01/16/2013   Patient Active Problem List   Diagnosis Date Noted  . PULMONARY NODULE, RIGHT UPPER LOBE 03/13/2008  . CARCINOMA, BASAL CELL, NECK 10/19/2006  . NEOPLASM, SKIN, UNCERTAIN BEHAVIOR 93/81/0175  . HYPERCHOLESTEROLEMIA,  PURE 10/10/2006  . HYPOTHYROIDISM 08/15/2006  . OSTEOPOROSIS 08/15/2006  . HYPERTENSION, BENIGN ESSENTIAL 10/24/2005  . ANXIETY 10/07/2005  . DEPRESSION 10/07/2005  . TRANSIENT ISCHEMIC ATTACKS, HX OF 02/06/2005   No past medical history on file. No past surgical history on file. History  Substance Use Topics  . Smoking status: Never Smoker   . Smokeless tobacco: Not on file  . Alcohol Use: Not on file   No family history on file. Allergies  Allergen Reactions  . Penicillins     REACTION: UNSPECIFIED  . Pneumococcal Vaccine Polyvalent   . Ramipril     REACTION: renal insuff  . Sulfonamide Derivatives    Current Outpatient Prescriptions on File Prior to Visit  Medication Sig Dispense Refill  . aspirin 81 MG tablet Take 81 mg by mouth daily.      . Calcium Carbonate-Vitamin D (CALCIUM 600/VITAMIN D) 600-400 MG-UNIT per tablet Take 1 tablet by mouth 2 (two) times daily.        . haloperidol (HALDOL) 0.5 MG tablet TAKE 1 TABLET BY MOUTH TWICE A DAY IN THE MORNING AND AT DINNER  180 tablet  0  . levothyroxine (SYNTHROID, LEVOTHROID) 75 MCG tablet TAKE 1 TABLET BY MOUTH EVERY DAY  30 tablet  3  . metoprolol tartrate (LOPRESSOR) 25 MG tablet TAKE 1  AND 1/2 TABLETS BY MOUTH TWICE A DAY **NEEDS OFFICE VISIT FOR MORE REFILLS**  45 tablet  0  . Nutritional Supplements (JUICE PLUS FIBRE PO) Take 6 capsules by mouth daily.       . ranitidine (ZANTAC) 150 MG tablet Take 150 mg by mouth daily.      . Omega-3 Fatty Acids (FISH OIL) 300 MG CAPS Take 1 capsule by mouth daily.         No current facility-administered medications on file prior to visit.    Review of Systems  See HPI  No changes in bowel habits +SOB, no CP- did not follow up with Dr. Josefa Half for 6 month follow up as she was supposed to No dizziness No recurrent syncope Denies SI or HI Has had some lesions on her skin   Physical Exam  BP 126/62  Pulse 67  Temp(Src) 97.6 F (36.4 C) (Oral)  Ht 5' 2.75" (1.594 m)  Wt  135 lb (61.236 kg)  BMI 24.10 kg/m2  SpO2 99%  General: Well-developed,well-nourished,in no acute distress; alert,appropriate and cooperative throughout examination  Mouth: Oral mucosa and oropharynx without lesions or exudates. Teeth in good repair.  Neck: No deformities, masses, or tenderness noted.  Lungs: Normal respiratory effort, chest expands symmetrically. Lungs are clear to auscultation, no crackles or wheezes.  Heart: Normal rate and regular rhythm. S1 and S2 normal without gallop, murmur, click, rub or other extra sounds.  Extremities: No clubbing, cyanosis, or deformity noted with normal full range of motion of all joints. no significant edema.  Neurologic: No cranial nerve deficits noted. Station and gait are normal. Plantar reflexes are down-going bilaterally. DTRs are symmetrical throughout. Sensory, motor and coordinative functions appear intact.  Psych: Oriented X3, memory intact for recent and remote, normally interactive, good eye contact, not depressed appearing, and slightly anxious.  Skin:  Large raised erythematous, scaly lesion right temple area  A

## 2013-12-13 NOTE — Assessment & Plan Note (Signed)
With pacer placement. Advised follow up with Dr. Josefa Half which is overdue. Also check labs today: Orders Placed This Encounter  Procedures  . TSH  . T4, Free  . Comprehensive metabolic panel  . Lipid panel  . CBC with Differential  . Ambulatory referral to Urology

## 2013-12-13 NOTE — Assessment & Plan Note (Signed)
Well controlled 

## 2013-12-13 NOTE — Patient Instructions (Addendum)
Good to see you. Please schedule an appointment with Dr. Saralyn Pilar.  Ok to try to wean off of Haloperidol.  Let me know how it goes.  We will call you with your lab results and your urology referral.

## 2013-12-14 ENCOUNTER — Other Ambulatory Visit: Payer: Self-pay | Admitting: Family Medicine

## 2013-12-16 ENCOUNTER — Telehealth: Payer: Self-pay | Admitting: Family Medicine

## 2013-12-16 NOTE — Telephone Encounter (Signed)
Relevant patient education mailed to patient.  

## 2013-12-30 LAB — CBC CANCER CENTER
BASOS ABS: 0.3 x10 3/mm — AB (ref 0.0–0.1)
Basophil %: 2.3 %
Eosinophil #: 1.6 x10 3/mm — ABNORMAL HIGH (ref 0.0–0.7)
Eosinophil %: 12.6 %
HCT: 41.4 % (ref 35.0–47.0)
HGB: 11.9 g/dL — ABNORMAL LOW (ref 12.0–16.0)
LYMPHS ABS: 1.7 x10 3/mm (ref 1.0–3.6)
Lymphocyte %: 13.6 %
MCH: 21.7 pg — AB (ref 26.0–34.0)
MCHC: 28.9 g/dL — ABNORMAL LOW (ref 32.0–36.0)
MCV: 75 fL — ABNORMAL LOW (ref 80–100)
Monocyte #: 0.5 x10 3/mm (ref 0.2–0.9)
Monocyte %: 3.9 %
NEUTROS PCT: 67.6 %
Neutrophil #: 8.4 x10 3/mm — ABNORMAL HIGH (ref 1.4–6.5)
PLATELETS: 162 x10 3/mm (ref 150–440)
RBC: 5.5 10*6/uL — AB (ref 3.80–5.20)
RDW: 23.8 % — AB (ref 11.5–14.5)
WBC: 12.4 x10 3/mm — AB (ref 3.6–11.0)

## 2014-01-03 ENCOUNTER — Telehealth: Payer: Self-pay | Admitting: Family Medicine

## 2014-01-03 ENCOUNTER — Other Ambulatory Visit: Payer: Self-pay | Admitting: Family Medicine

## 2014-01-03 NOTE — Telephone Encounter (Signed)
The [facility] nurse manager came by with a list of patients who had high Medicare claim amounts in the past 12 months due to hospital admissions.  This patient was on the list.  Would you like for [facility] to reach out to the patient?  If so, I will place a referral form in your box and you can give it to your CMA to fax to [facility]. 

## 2014-01-03 NOTE — Telephone Encounter (Signed)
Referral form placed in your inbox.

## 2014-01-03 NOTE — Telephone Encounter (Signed)
Yes ok for [facility] to reach out to her.

## 2014-01-07 ENCOUNTER — Ambulatory Visit: Payer: Self-pay | Admitting: Internal Medicine

## 2014-01-22 ENCOUNTER — Other Ambulatory Visit: Payer: Self-pay

## 2014-01-22 MED ORDER — METOPROLOL TARTRATE 25 MG PO TABS: ORAL_TABLET | ORAL | Status: DC

## 2014-01-22 NOTE — Telephone Encounter (Signed)
Ok to refill as requested 

## 2014-01-22 NOTE — Telephone Encounter (Signed)
Bynum pts son left v/m; pt has taken metoprolol tartrate 1 1/2 tab in AM and 1 1/2 tab at night; med list has 1 1/2 tab daily. Bynum request refill with corrected instructions to [facility] request cb. Have not changed med list instructions until approved by Dr Deborra Medina.Please advise.

## 2014-01-22 NOTE — Telephone Encounter (Signed)
Spoke to pts son and advised him Rx has been sent to requested pharmacy. Med list updated to reflect change

## 2014-01-27 LAB — CBC CANCER CENTER
Basophil #: 0 x10 3/mm (ref 0.0–0.1)
Basophil %: 0.3 %
Eosinophil #: 1.4 x10 3/mm — ABNORMAL HIGH (ref 0.0–0.7)
Eosinophil %: 12.2 %
HCT: 43.2 % (ref 35.0–47.0)
HGB: 12.5 g/dL (ref 12.0–16.0)
LYMPHS PCT: 13.6 %
Lymphocyte #: 1.5 x10 3/mm (ref 1.0–3.6)
MCH: 21.7 pg — AB (ref 26.0–34.0)
MCHC: 29 g/dL — ABNORMAL LOW (ref 32.0–36.0)
MCV: 75 fL — ABNORMAL LOW (ref 80–100)
MONOS PCT: 3.3 %
Monocyte #: 0.4 x10 3/mm (ref 0.2–0.9)
NEUTROS ABS: 7.9 x10 3/mm — AB (ref 1.4–6.5)
Neutrophil %: 70.6 %
PLATELETS: 150 x10 3/mm (ref 150–440)
RBC: 5.78 10*6/uL — ABNORMAL HIGH (ref 3.80–5.20)
RDW: 24 % — AB (ref 11.5–14.5)
WBC: 11.2 x10 3/mm — ABNORMAL HIGH (ref 3.6–11.0)

## 2014-02-06 ENCOUNTER — Ambulatory Visit: Payer: Self-pay | Admitting: Internal Medicine

## 2014-02-24 LAB — CANCER CENTER HEMATOCRIT: HCT: 41.7 % (ref 35.0–47.0)

## 2014-03-09 ENCOUNTER — Ambulatory Visit: Payer: Self-pay | Admitting: Internal Medicine

## 2014-03-19 ENCOUNTER — Other Ambulatory Visit: Payer: Self-pay | Admitting: *Deleted

## 2014-03-19 MED ORDER — METOPROLOL TARTRATE 25 MG PO TABS: ORAL_TABLET | ORAL | Status: DC

## 2014-03-24 LAB — CBC CANCER CENTER
Basophil #: 0.3 x10 3/mm — ABNORMAL HIGH (ref 0.0–0.1)
Basophil %: 3.4 %
EOS ABS: 1.1 x10 3/mm — AB (ref 0.0–0.7)
Eosinophil %: 10.5 %
HCT: 44.1 % (ref 35.0–47.0)
HGB: 12.7 g/dL (ref 12.0–16.0)
LYMPHS PCT: 13.8 %
Lymphocyte #: 1.4 x10 3/mm (ref 1.0–3.6)
MCH: 22.1 pg — ABNORMAL LOW (ref 26.0–34.0)
MCHC: 28.9 g/dL — ABNORMAL LOW (ref 32.0–36.0)
MCV: 77 fL — AB (ref 80–100)
MONO ABS: 0.5 x10 3/mm (ref 0.2–0.9)
Monocyte %: 5 %
NEUTROS PCT: 67.3 %
Neutrophil #: 6.9 x10 3/mm — ABNORMAL HIGH (ref 1.4–6.5)
Platelet: 144 x10 3/mm — ABNORMAL LOW (ref 150–440)
RBC: 5.77 10*6/uL — ABNORMAL HIGH (ref 3.80–5.20)
RDW: 24 % — AB (ref 11.5–14.5)
WBC: 10.2 x10 3/mm (ref 3.6–11.0)

## 2014-03-31 LAB — CANCER CENTER HEMATOCRIT: HCT: 45.5 % (ref 35.0–47.0)

## 2014-04-07 ENCOUNTER — Other Ambulatory Visit: Payer: Self-pay | Admitting: *Deleted

## 2014-04-07 MED ORDER — LEVOTHYROXINE SODIUM 75 MCG PO TABS: 75.0000 ug | ORAL_TABLET | Freq: Every day | ORAL | Status: DC

## 2014-04-08 ENCOUNTER — Ambulatory Visit: Payer: Self-pay | Admitting: Internal Medicine

## 2014-04-16 LAB — CBC CANCER CENTER
Basophil #: 0 x10 3/mm (ref 0.0–0.1)
Basophil %: 0.4 %
EOS ABS: 0.9 x10 3/mm — AB (ref 0.0–0.7)
EOS PCT: 9.2 %
HCT: 40.8 % (ref 35.0–47.0)
HGB: 11.8 g/dL — AB (ref 12.0–16.0)
LYMPHS ABS: 1.1 x10 3/mm (ref 1.0–3.6)
Lymphocyte %: 11.8 %
MCH: 22.1 pg — ABNORMAL LOW (ref 26.0–34.0)
MCHC: 28.9 g/dL — ABNORMAL LOW (ref 32.0–36.0)
MCV: 77 fL — AB (ref 80–100)
MONOS PCT: 5.7 %
Monocyte #: 0.5 x10 3/mm (ref 0.2–0.9)
NEUTROS ABS: 7 x10 3/mm — AB (ref 1.4–6.5)
Neutrophil %: 72.9 %
PLATELETS: 163 x10 3/mm (ref 150–440)
RBC: 5.34 10*6/uL — AB (ref 3.80–5.20)
RDW: 24.1 % — ABNORMAL HIGH (ref 11.5–14.5)
WBC: 9.6 x10 3/mm (ref 3.6–11.0)

## 2014-05-09 ENCOUNTER — Ambulatory Visit: Payer: Self-pay | Admitting: Internal Medicine

## 2014-05-14 LAB — CBC CANCER CENTER
BASOS PCT: 2.3 %
Basophil #: 0.2 x10 3/mm — ABNORMAL HIGH (ref 0.0–0.1)
Eosinophil #: 0.8 x10 3/mm — ABNORMAL HIGH (ref 0.0–0.7)
Eosinophil %: 7.9 %
HCT: 39.1 % (ref 35.0–47.0)
HGB: 11.5 g/dL — AB (ref 12.0–16.0)
Lymphocyte #: 1.2 x10 3/mm (ref 1.0–3.6)
Lymphocyte %: 12.8 %
MCH: 22.7 pg — AB (ref 26.0–34.0)
MCHC: 29.5 g/dL — AB (ref 32.0–36.0)
MCV: 77 fL — ABNORMAL LOW (ref 80–100)
MONO ABS: 0.5 x10 3/mm (ref 0.2–0.9)
Monocyte %: 4.8 %
NEUTROS PCT: 72.2 %
Neutrophil #: 6.9 x10 3/mm — ABNORMAL HIGH (ref 1.4–6.5)
Platelet: 134 x10 3/mm — ABNORMAL LOW (ref 150–440)
RBC: 5.08 10*6/uL (ref 3.80–5.20)
RDW: 23.5 % — ABNORMAL HIGH (ref 11.5–14.5)
WBC: 9.6 x10 3/mm (ref 3.6–11.0)

## 2014-05-24 ENCOUNTER — Other Ambulatory Visit: Payer: Self-pay | Admitting: Family Medicine

## 2014-05-28 LAB — CANCER CTR PLATELET CT: PLATELETS: 170 x10 3/mm (ref 150–440)

## 2014-06-09 ENCOUNTER — Ambulatory Visit: Payer: Self-pay | Admitting: Internal Medicine

## 2014-06-22 ENCOUNTER — Other Ambulatory Visit: Payer: Self-pay | Admitting: Family Medicine

## 2014-06-27 ENCOUNTER — Other Ambulatory Visit: Payer: Self-pay | Admitting: Family Medicine

## 2014-07-06 ENCOUNTER — Other Ambulatory Visit: Payer: Self-pay | Admitting: Family Medicine

## 2014-07-08 ENCOUNTER — Ambulatory Visit
Admit: 2014-07-08 | Disposition: A | Discharge status: Home / Self Care | Payer: Self-pay | Attending: Internal Medicine | Admitting: Internal Medicine

## 2014-07-10 ENCOUNTER — Other Ambulatory Visit: Payer: Self-pay | Admitting: Family Medicine

## 2014-07-10 DIAGNOSIS — E039 Hypothyroidism, unspecified: Secondary | ICD-10-CM

## 2014-07-10 DIAGNOSIS — I519 Heart disease, unspecified: Principal | ICD-10-CM

## 2014-07-14 ENCOUNTER — Other Ambulatory Visit: Payer: Self-pay | Admitting: Family Medicine

## 2014-07-14 ENCOUNTER — Other Ambulatory Visit (INDEPENDENT_AMBULATORY_CARE_PROVIDER_SITE_OTHER): Payer: Medicare Other

## 2014-07-14 DIAGNOSIS — I519 Heart disease, unspecified: Principal | ICD-10-CM

## 2014-07-14 DIAGNOSIS — E039 Hypothyroidism, unspecified: Secondary | ICD-10-CM | POA: Diagnosis not present

## 2014-07-14 LAB — T4, FREE: FREE T4: 0.92 ng/dL (ref 0.60–1.60)

## 2014-07-14 LAB — TSH: TSH: 11.09 u[IU]/mL — AB (ref 0.35–4.50)

## 2014-07-15 MED ORDER — LEVOTHYROXINE SODIUM 88 MCG PO TABS: 88.0000 ug | ORAL_TABLET | Freq: Every day | ORAL | Status: AC

## 2014-07-15 NOTE — Addendum Note (Signed)
Addended by: Modena Nunnery on: 07/15/2014 03:34 PM   Modules accepted: Orders

## 2014-07-21 ENCOUNTER — Ambulatory Visit: Payer: Medicare Other | Admitting: Family Medicine

## 2014-07-31 ENCOUNTER — Other Ambulatory Visit: Payer: Self-pay | Admitting: Family Medicine

## 2014-08-04 ENCOUNTER — Ambulatory Visit
Admit: 2014-08-04 | Disposition: A | Discharge status: Home / Self Care | Payer: Self-pay | Attending: Internal Medicine | Admitting: Internal Medicine

## 2014-08-08 ENCOUNTER — Ambulatory Visit
Admit: 2014-08-08 | Disposition: A | Discharge status: Home / Self Care | Payer: Self-pay | Attending: Internal Medicine | Admitting: Internal Medicine

## 2014-08-22 IMAGING — CR DG CHEST 1V PORT
1 series · 1 of 1 positions shown · non-contrast
Comparison: none

REASON FOR EXAM: chest tightness, wheezing, recent pneumonia
COMMENTS:

[ap]
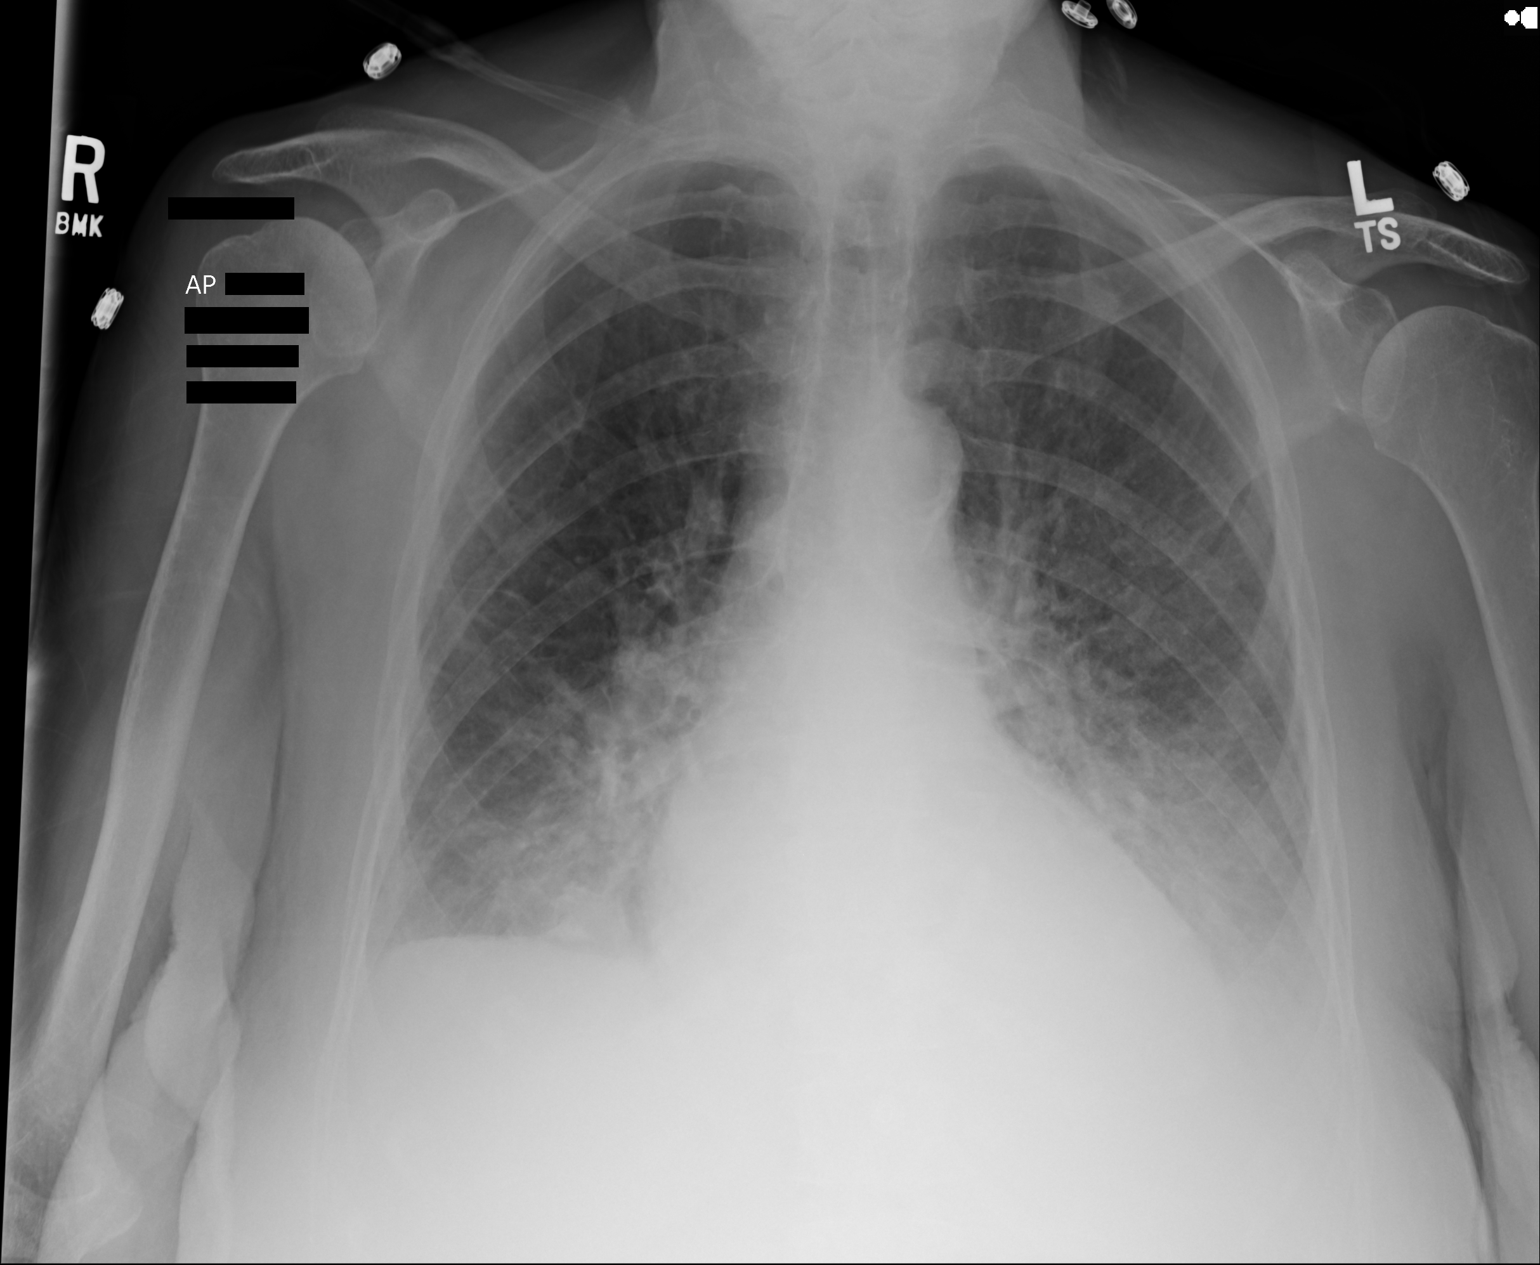

[1 of 1 positions shown; findings below may reference images not displayed]

PROCEDURE:     DXR - DXR PORTABLE CHEST SINGLE VIEW  - February 21, 2013  [DATE]

RESULT:     Comparison made to prior study of 02/16/2013. Bilateral
pulmonary alveolar infiltrates are present. Cardiomegaly with pulmonary
vascular prominence present. These findings are consistent with CHF and
pulmonary edema. Bilateral pneumonia cannot be excluded.
IMPRESSION: CHF with bilateral pulmonary edema versus bilateral
pneumonia. Findings have progressed from 02/16/2013.

## 2014-08-26 IMAGING — CR DG CHEST 1V PORT
1 series · 1 of 1 positions shown · non-contrast
Comparison: none

REASON FOR EXAM: Pacemaker
COMMENTS:

[ap]
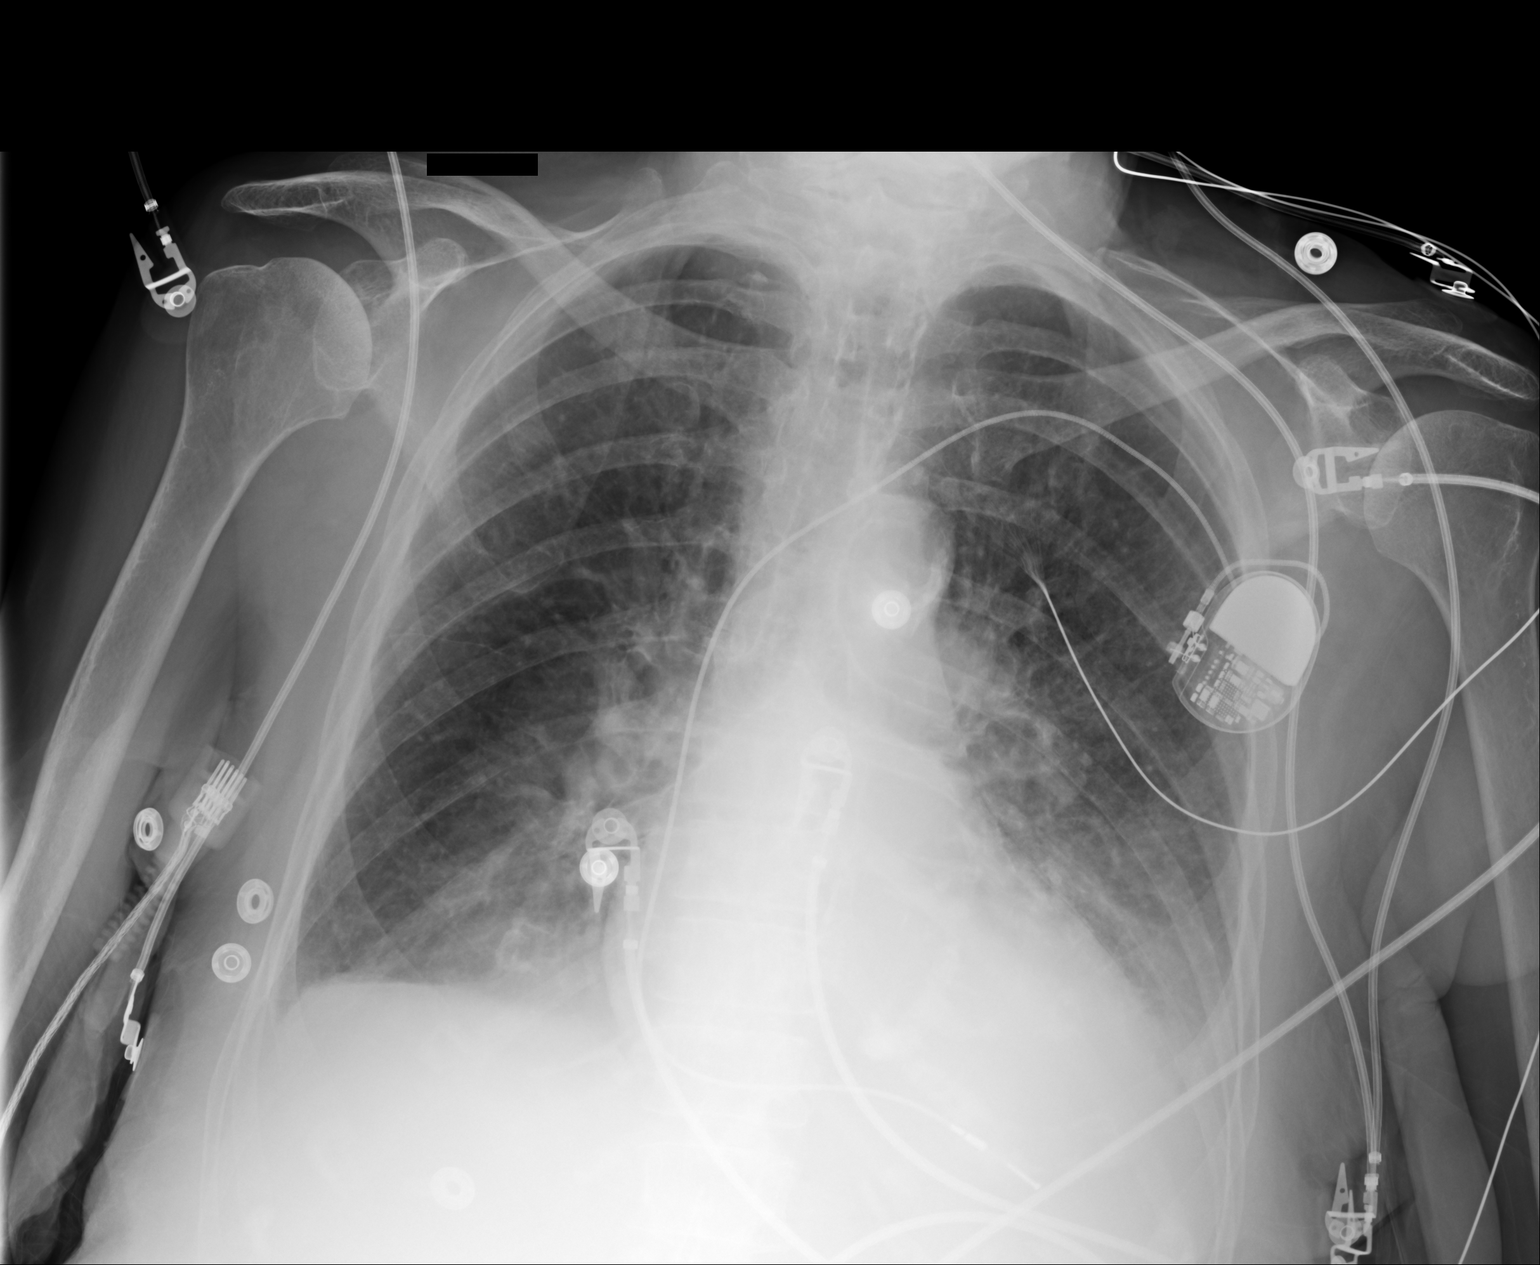

[1 of 1 positions shown; findings below may reference images not displayed]

PROCEDURE:     DXR - DXR PORTABLE CHEST SINGLE VIEW  - February 25, 2013  [DATE]

RESULT:     Comparison is made to study February 24, 2013.

The lungs are better inflated today. The left hemidiaphragm remains obscured
but the right hemidiaphragm is better demonstrated. The cardiac silhouette
is top normal in size but stable. The central pulmonary vascularity and
perihilar lung markings are slightly less conspicuous today. A permanent
pacemaker has been placed without evidence of a post procedure complication.
IMPRESSION: There has been mild interval improvement of the pulmonary
interstitium and pulmonary vascularity since placement of a permanent
pacemaker.

[REDACTED]

## 2014-08-29 NOTE — H&P (Signed)
PATIENT NAME:  Donna [name], Donna [name] MR#:  [id number] DATE OF BIRTH:  1923-04-25  DATE OF ADMISSION:  02/16/2013  REFERRING PHYSICIAN:  Dr.  Karma Greaser  PRIMARY CARE PHYSICIAN:  Dr. Council Mechanic  CHIEF COMPLAINT:  Shortness of breath.   HISTORY OF PRESENT ILLNESS: Donna [name] is an 81 year old Caucasian female with past medical history of polycythemia vera requiring phlebotomy, hypothyroidism, as well as a history of TIA, who is presenting with shortness of breath. She describes her symptoms as a 2-day duration, gradually worsening shortness of breath, with associated nonproductive cough, subjective fever and chills. She denies any recent sick contacts. She denies any leg pain or edema. Denies any chest pain. Upon presentation to the Emergency Department, she was found to be hypoxemic, requiring supplemental O2 to keep SAO2 greater than 92%. She was also remarkably tachypneic, which is improved with oxygen therapy. Currently she is without complaint, saying that she is feeling better, and she is on 3.5 liters O2 nasal cannula.   REVIEW OF SYSTEMS:   CONSTITUTIONAL: Positive for subjective fevers and chills. She denies any fatigue, weakness or pain.  EYES: Denies any blurred vision, double vision or eye pain.  ENT: Denies ear pain, hearing loss, discharge, postnasal drip or dysphagia.  RESPIRATORY: Positive nonproductive cough. Denies any wheezing or hemoptysis. Positive shortness of breath as well as dyspnea on exertion.  CARDIOVASCULAR: Denies chest pain, palpitations, orthopnea or edema.  GASTROINTESTINAL: Denies nausea, vomiting, diarrhea, abdominal pain.  GENITOURINARY: Denies dysuria, hematuria.  ENDOCRINE: Denies nocturia or thyroid problems.  HEMATOLOGIC AND LYMPHATIC: Denies easy bruising or bleeding.  SKIN: Denies any rashes or lesions.  MUSCULOSKELETAL: Denies any pain in her neck, back, shoulders, knees. Denies any arthritis, swelling or gout.  NEUROLOGIC:  Denies any paralysis or paresthesias.   PSYCHIATRIC: Denies any anxiety or depressive symptoms.  Otherwise, full review of systems performed by me is negative.   PAST MEDICAL HISTORY: Polycythemia vera requiring phlebotomy, hypothyroidism, history of TIA.  SOCIAL HISTORY: Denies any alcohol or tobacco or drug usage. She does live alone.   FAMILY HISTORY: Denies any history of cardiovascular or pulmonary disease. Denies any history of blood clots.   ALLERGIES: PENICILLIN and SULFA.   HOME MEDICATIONS INCLUDE:  Advil 200 mg 2 capsules once daily as needed for pain,  Norvasc 10 mg p.o. daily, aspirin 81 mg p.o. daily, calcium plus vitamin D 630 mg/400 international units 2 times daily, doxycycline 15 mg p.o. daily, enalapril 2.5 mg p.o. daily, Synthroid 75 mcg p.o. daily, metoprolol 37.5 mg p.o. b.i.d., oxybutynin 5 mg p.o. daily, ranitidine 150 mg p.o. daily.   PHYSICAL EXAMINATION: VITAL SIGNS: Temperature 99.4, pulse 53, respirations 56 on arrival, blood pressure 168/71, saturating 87% on room air. Weight 59.9 kg, BMI 23.4  GENERAL: A well-nourished, well-developed Caucasian female who is in minimal distress secondary to respiratory status. HEAD:  Normocephalic, atraumatic.  EYES: Pupils equal, round and reactive to light as well as accommodation. Extraocular muscles intact. No scleral icterus.  MOUTH: Dry mucosal membranes. Dentition intact. No abscesses noted.  EARS, NOSE, THROAT:  Throat clear, without exudate. No external lesions.  NECK: Supple. No thyromegaly or nodules. No JVD.  PULMONARY: Diffuse scant expiratory wheeze and prolonged expiratory phase. Right basilar rhonchi. No use of accessory muscles. Good respiratory effort currently on nasal cannula 3.5 liters. CHEST: Nontender to palpation.  CARDIOVASCULAR: S1, S2, bradycardic. No murmurs, rubs or gallops. No peripheral edema. Pedal pulses 2+.  GASTROINTESTINAL: Soft, nontender, nondistended. No masses. No hepatosplenomegaly. Positive bowel sounds.  MUSCULOSKELETAL:  No swelling, clubbing or edema. Range of motion full in all extremities.  NEUROLOGIC: Cranial nerves II through XII intact. No gross focal neurological deficits. Sensation and reflexes intact.  SKIN: No ulcerations, lesions, rashes or cyanosis. Skin is warm and dry. Turgor intact.  PSYCHIATRIC: Mood and affect within normal limits. Awake, alert, oriented x 3. Insight and judgment intact.   LABORATORY DATA:  EKG:  Sinus bradycardia. Sodium 131, potassium 4.5, chloride 99, bicarb 21, BUN 30, creatinine 1.84, with apparent baseline of 1.4, glucose 119. Total protein 7.1, albumin 3.3, bilirubin 0.8, alk phos 80, AST 25, ALT 19. TSH 0.345, free thyroxine 1.23. WBC 16.9, hemoglobin 11.3, MCV 74, RDW 23.2, platelets of 159. Chest x-ray: Flattened diaphragm, small bilateral pleural effusions, greater on the right than the left, with associated compressive atelectasis, small right lower lobe interstitial infiltrate.   ASSESSMENT AND PLAN: An 81 year old Caucasian female with history of polycythemia, hypothyroidism, as well as a transient ischemic attack, who is presenting with shortness of breath. Found to be hypoxemic on arrival, as well as findings consistent with pneumonia on chest x-ray.  1.  Sepsis in the setting of community-acquired pneumonia. Panculture, including blood and sputum. Antibiotics with Levaquin. Check strep and Legionella urinary antigens, given pleural effusion. IV fluids to keep MAP greater than 65. 2.  Hypoxemic respiratory failure secondary to community-acquired pneumonia. Supplemental O2 to keep SAO2 greater than 92%. Provide DuoNeb therapy q. 4 hours as well as incentive spirometry.  3.  Acute kidney injury. Intravenous fluid hydration. Continue with normal saline and follow up renal function.  4.  Hyponatremia. IV fluid hydration with normal saline. Follow sodium level.  5.  Hypothyroidism. Continue home dosage of Synthroid. 6.  Venous thrombosis prophylaxis with heparin subcu.    The patient is FULL CODE.   TIME SPENT:  45 minutes.    ____________________________ Aaron Mose. Elena Cothern, MD dkh:mr D: 02/16/2013 22:19:58 ET T: 02/16/2013 22:49:12 ET JOB#: [id number]  cc: Aaron Mose. Jennefer Kopp, MD, <Dictator> Kagen Kunath Woodfin Ganja MD ELECTRONICALLY SIGNED 02/16/2013 23:26

## 2014-08-29 NOTE — Op Note (Signed)
PATIENT NAME:  Donna [name], STEVISON MR#:  [id number] DATE OF BIRTH:  Aug 10, 1922  DATE OF PROCEDURE:  02/25/2013  REFERRING PHYSICIAN: Dr. Margaretmary Eddy.  PREPROCEDURE DIAGNOSIS:  Type 2 second-degree AV block.   PROCEDURE: Single-chamber pacemaker implantation.   POSTPROCEDURE DIAGNOSIS: Ventricular pacing.   INDICATION: The patient is an 81 year old female who is admitted with chief complaint of shortness of breath, as well as congestive heart failure. Following admission, the patient has had intermittent episodes of bradycardia with type 2 second-degree AV block. The patient has also had intermittent atrial fibrillation with a controlled ventricular rate. Procedure, risks, benefits  and alternatives of permanent pacemaker implantation were explained to the patient, and informed written consent was obtained.   She was brought to the operating room in a fasting state. The left pectoral region was prepped and draped in the usual sterile manner. Anesthesia was obtained with 1% Xylocaine locally. A 6 cm incision was performed over the left pectoral region. The pacemaker pocket was generated by electrocautery and blunt dissection. Access was obtained to the left subclavian vein by fine needle aspiration. A Medtronic ventricular lead [id number]) was positioned to right ventricular apical septum. After proper thresholds were obtained, the lead was sutured in place. The lead was connected to a single-chamber rate responsive pacemaker generator ([place]). The pacemaker pocket was irrigated with gentamicin solution. The pacemaker generator was positioned in the pocket, and the pocket was closed with 2-0 and 4-0 Vicryl, respectively. Steri-Strips and a pressure dressing were applied.    ____________________________ Isaias Cowman, MD ap:dmm D: 02/25/2013 13:18:00 ET T: 02/25/2013 19:16:36 ET JOB#: [id number]  cc: Isaias Cowman, MD, <Dictator> Isaias Cowman MD ELECTRONICALLY SIGNED 03/27/2013 9:29

## 2014-08-29 NOTE — Consult Note (Signed)
PATIENT NAME:  Donna [name], Donna [name] MR#:  [id number] DATE OF BIRTH:  03/31/1923  DATE OF CONSULTATION:  02/21/2013  REFERRING PHYSICIAN:  Dr. Margaretmary Eddy CONSULTING PHYSICIAN:  Corey Skains, MD  REASON FOR CONSULTATION:  Shortness of breath, chest tightness with new onset congestive heart failure with hypoxia, with hypertension.   CHIEF COMPLAINT:  "I am short of breath."   HISTORY OF PRESENT ILLNESS:  This is an 81 year old female from the nursing home with multiple medical problems including polycythemia vera requiring phlebotomy, having a recent discharge to home and/or nursing home from the hospital.  At that time the patient has had significant worsening shortness of breath, weakness and fatigue and hypoxia with a chest x-ray consistent with pulmonary edema.  She also had lower extremity edema.  With this, the patient has had acute chest discomfort radiating into her back and tightness.  The patient received oxygen and diuretics which has improved this at this time.  The patient has had no evidence of EKG changes of myocardial infarction with a normal troponin and CK-MB.  The patient also has had nebulizers with improvement.  There is no current evidence of documentation of previous echocardiogram or known heart failure.  She now is much more comfortable.    REVIEW OF SYSTEMS:  The remainder of her review of systems is negative for vision change, ringing in the ears, hearing loss, cough, heartburn, nausea, vomiting, diarrhea, bloody stools, stomach pain, extremity pain, leg weakness, cramping of the buttocks, known blood clots, headaches, blackouts, dizzy spells, nosebleeds, congestion, trouble swallowing, frequent urination, urination at night, muscle weakness, numbness, anxiety, depression, skin lesions, skin rashes.   PAST MEDICAL HISTORY: 1.  Hypertension.  2.  Hyperlipidemia.  3.  Gastroesophageal reflux.   FAMILY HISTORY:  No family members with early onset of cardiovascular disease or  hypertension.   SOCIAL HISTORY:  Currently denies alcohol or tobacco use.   ALLERGIES:  AS LISTED.   MEDICATIONS:  As listed.   PHYSICAL EXAMINATION: VITAL SIGNS:  Blood pressure is 157/62 bilaterally, heart rate is 48 upright and 52 reclining and regular.  GENERAL:  She is a well-appearing female in no acute distress.  HEAD, EYES, EARS, NOSE, THROAT:  No icterus, thyromegaly, ulcers, hemorrhage or xanthelasma.  CARDIOVASCULAR:  Regular rate and rhythm.  Normal S1 and S2 with a 2 out of 6 apical murmur consistent with mitral regurgitation.  PMI is diffuse.  Carotid upstroke normal without bruit.  Jugular venous pressure is normal.  LUNGS:  Have decreased breath sounds in the bases with expiratory wheezes and crackles.  ABDOMEN:  Soft, nontender, without hepatosplenomegaly or masses.  Abdominal aorta is normal size without bruit.  EXTREMITIES:  2+ radial, femoral, dorsal pedal pulses, with 1+ lower extremity edema.  No cyanosis, clubbing, ulcers. NEUROLOGIC:  She is oriented to time, place and person, with normal mood and affect.   ASSESSMENT:  An 81 year old female with acute systolic dysfunction, congestive heart failure with pulmonary edema and chronic kidney disease without evidence of myocardial infarction.   RECOMMENDATIONS: 1.  Lasix for lower extremity edema, congestive heart failure.  2.  Echocardiogram to assess systolic versus diastolic dysfunction, congestive heart failure.  3.  Metoprolol and/or ACE inhibitor if able for hypertension control and cardiomyopathy.  4.  Further treatment of possible pulmonary infection.  5.  Further diagnostic testing and treatment options after above.   ____________________________ Corey Skains, MD bjk:ea D: 02/21/2013 18:39:49 ET T: 02/21/2013 23:28:43 ET JOB#: [id number]  cc: Corey Skains,  MD, <Dictator> Corey Skains MD ELECTRONICALLY SIGNED 03/03/2013 8:53

## 2014-08-29 NOTE — Discharge Summary (Signed)
PATIENT NAME:  Donna [name], Donna [name] MR#:  [id number] DATE OF BIRTH:  05/24/1922  PRIMARY CARE PHYSICIAN: Dr. Council Mechanic.   CHIEF COMPLAINT: Shortness of breath.   DISCHARGE DIAGNOSES:  1.  Severe sepsis, resolved, in the setting of a pneumonia.  2.  Pneumonia.  3.  Acute respiratory failure, resolved.  4.  Acute-on-chronic renal failure, improving.  5.  Hypertension.  6.  Hypothyroidism.  7.  History of polycythemia vera, requiring phlebotomy.  8.  History of transient ischemic attack.   DISCHARGE MEDICATIONS: Citric acid/sodium citrate 30 mL two times a day with apple juice, levothyroxine 75 mcg daily, amlodipine 10 mg daily, enalapril 2.5 mg in the morning, aspirin 81 mg daily, ranitidine 150 mg once a day in the morning, calcium and vitamin D one tab 2 times a day, Juice Plus+ fruit capsule 2 caps once a day, Juice Plus+ Vineyard Blend 2 caps once a day, Juice Plus+ veggie capsule 2 caps once a day, acetaminophen 650 mg every 4 hours as needed for pain or temperature greater than 100.4, Levaquin 250 mg one tab every 48 hours for 3 doses, albuterol/ipratropium 3 mL inhaled four times a day as needed for wheezing, haloperidol 0.5 mg one tab 2 times a day.   She will be getting discharged with 2 liters nasal cannula continuous.   DIET: Low sodium.   ACTIVITY: As tolerated.   FOLLOWUP: Please follow with PCP within 1 to 2 weeks.   SIGNIFICANT LABORATORIES AND IMAGING: Strep pneumonia antigen negative. Legionella urine antigens negative. Blood cultures on arrival: No growth to date. Initial BUN 30, creatinine 1.84 with a GFR of 24. Last creatinine of 1.8, BUN of 29. LFTs on arrival showed albumin of 3.3; otherwise, within normal limits. Troponin negative. Initial TSH 0.345. Free thyroxine was 1.23. Initial WBC 16.9. WBC 11.5 on October 12.   X-ray, chest, PA and lateral: Right lower lobe airspace disease concerning for pneumonia. There is bilateral diffuse interstitial thickening which may reflect  pneumonitis versus mild interstitial edema. Small right pleural effusion.   HISTORY OF PRESENT ILLNESS AND HOSPITAL COURSE: For full details of H and P, please see the dictation on October 11 by Dr. Lavetta Nielsen, but briefly this is a pleasant 81 year old female who came in for shortness of breath for a couple of days, gradually worsening, associated with nonproductive cough, fevers, and chills. She initially did require oxygenation to maintain her sats greater than 92%, was tachypneic which improved with oxygen. She was admitted to the hospitalist service for suspected pneumonia. She did have sepsis on arrival per criteria. She was started on Levaquin.   Urine strep and Legionella antigens were checked which have been negative. She was also started on some fluids and oxygen. She did have a mild renal failure at baseline. She states her GFR is about 30, and she had a mild renal failure as well. That has been stable on fluids which have been stopped. Her symptoms have improved. She has been titrated down to 1 liter. Her breathing is significantly better.   She will be discharged with additional 3 doses of renally dosed Levaquin. She did have severe sepsis per criteria with hypoxia, tachypnea, fevers. She has no further fevers.   At this point she will be discharged with outpatient follow-up.   PHYSICAL EXAMINATION:  VITAL SIGNS: On the day of discharge, temperature is 98.2, pulse rate is 90, respiratory rate 17, blood pressure 110/59 with MAP of 76, O2 saturation of 92% on 1 liter oxygen.  GENERAL:  She is sitting at the bed. A well-developed Caucasian female in no obvious distress.  HEENT: Normocephalic, atraumatic.  LUNGS: Appear to be clear on auscultation. Normal rhythm.  HEART: Heart sound without significant murmurs.  ABDOMEN: Benign.  EXTREMITIES: No lower extremity edema.   CODE STATUS: FULL CODE.   TOTAL TIME SPENT: 35 minutes.     ____________________________ Donna Presto,  MD sa:np D: 02/20/2013 15:37:56 ET T: 02/20/2013 16:03:06 ET JOB#: [id number]  cc: Donna Presto, MD, <Dictator> Donna Presto MD ELECTRONICALLY SIGNED 03/08/2013 14:51

## 2014-08-29 NOTE — Op Note (Signed)
PATIENT NAME:  Donna [name], Donna [name] MR#:  [id number] DATE OF BIRTH:  05/01/1923  DATE OF PROCEDURE:  02/25/2013  REFERRING PHYSICIAN: Dr. Margaretmary Eddy.   PREPROCEDURAL DIAGNOSIS: Type II second-degree arteriovenous block.   PROCEDURE: Single-chamber pacemaker implantation.   POSTPROCEDURE DIAGNOSIS: Ventricular pacing.   INDICATION: The patient is an 81 year old female, who is admitted with chief complaint of shortness of breath as well as congestive heart failure. Following admission, the patient has had intermittent episodes of bradycardia with type II second-degree AV block. The patient has also had intermittent atrial fibrillation with a controlled ventricular rate. Procedure, risks, benefits, and alternatives of permanent pacemaker implantation were explained to the patient and informed written consent was obtained.   DESCRIPTION OF PROCEDURE: She was brought to the operating room in a fasting state. The left pectoral region was prepped and draped in the usual sterile manner. Anesthesia was obtained with 1% Xylocaine locally. A 6 cm incision was performed over the left pectoral region. The pacemaker pocket was generated by electrocautery and blunt dissection. Access was obtained to the left subclavian vein by fine needle aspiration. A Medtronic ventricular lead [id number]) was positioned in the right ventricular apical septum after proper thresholds were obtained. The lead was sutured in place. The lead was connected to a single-chamber rate responsive pacemaker generator (Medtronic Adapta [id number]. The pacemaker pocket was irrigated with gentamicin solution. The pacemaker generator was positioned in the pocket and the pocket was closed with 2-0 and 4-0 Vicryl, respectively. Steri-Strips and pressure dressing were applied.   ____________________________ Isaias Cowman, MD ap:aw D: 02/25/2013 13:18:40 ET T: 02/25/2013 14:24:42 ET JOB#: [id number]  cc: Isaias Cowman, MD, <Dictator> Isaias Cowman  MD ELECTRONICALLY SIGNED 03/27/2013 9:29

## 2014-08-29 NOTE — H&P (Signed)
PATIENT NAME:  Donna [name], Donna [name] MR#:  [id number] DATE OF BIRTH:  05/05/1923  DATE OF ADMISSION:  02/21/2013  PRIMARY CARE PHYSICIAN:  Dr. Council Mechanic.  REFERRING PHYSICIAN:  Dr. Dahlia Client.  CHIEF COMPLAINT:  Shortness of breath and chest tightness.   HISTORY OF PRESENT ILLNESS:  The patient is an 81 year old Caucasian female with multiple medical problems including polycythemia vera requiring phlebotomy, hypothyroidism was recently admitted to the hospital with shortness of breath and was diagnosed with sepsis with pneumonia and was just discharged to [place] home yesterday evening at around 6:00 p.m., is presenting back to the ER with a chief complaint of chest tightness associated with shortness of breath and swelling in her legs.  The patient is reporting that at around midnight she was having chest pain associated with shortness of breath and her pulse ox was in the low 80s.  EMS was called and nursing home staff has given her 320 mg of aspirin and two breathing treatments with no significant improvement.  The patient was eventually saturating 87% on room air and had a pulse ox significantly increased to 94% on 3 liters of nasal cannula.  The patient's BNP is elevated at 6847, but the initial troponin is negative.  Chest x-ray has revealed pulmonary edema.  The patient is also diffusely wheezing and complaining of chest tightness.  She is still coughing.  Denies any past medical history of COPD, emphysema.  The patient is complaining of diffuse chest soreness associated with tightness which is reproducible with palpation.  Denies any nausea.  Hospitalist team is called to admit the patient after she has received Lasix and nebulizer treatments.  Also, the patient has received Solu-Medrol 125 mg IV because of the diffuse wheezing.   PAST MEDICAL HISTORY:  Recent history of severe sepsis from pneumonia, hypothyroidism, polycythemia vera requiring phlebotomy, history of TIA, hypertension, hypothyroidism,  chronic renal failure.   PAST SURGICAL HISTORY:  Phlebotomies for polycythemia vera.    ALLERGIES:  PENICILLIN AND SULFA.   PSYCHOSOCIAL HISTORY:  Just discharged to [facility] October 15th.  No history of smoking, alcohol or illicit drug usage.  Lives alone.   FAMILY HISTORY:  Dad and mom were healthy.  Denies any history of cardiovascular or pulmonary disease.  Denies any history of blood clots.   HOME MEDICATIONS:  Metoprolol tartrate 25 mg by mouth twice a day, Levothyroxine 75 mcg by mouth once daily, Levaquin 250 mg by mouth q. 48 hours, haloperidol 0.5 mg 2 times a day, enalapril 2.5 mg once daily, calcium with vitamin D 1 capsule by mouth 2 times a day, aspirin 81 mg once daily, amlodipine 10 mg once daily, DuoNebs inhalation 4 times a day, Tylenol 325 mg 2 tablets by mouth q. 4 hours as needed.   REVIEW OF SYSTEMS:   CONSTITUTIONAL:  Denies any fever, but complaining of fatigue and weight gain.  EYES:  Denies blurry vision, glaucoma.  EARS, NOSE, THROAT:  Denies epistaxis or discharge.  RESPIRATION:  Complaining of cough, chest tightness.  No history of COPD.  Complaining of shortness of breath.  CARDIOVASCULAR:  Complaining of diffuse chest pain with soreness and tightness which is reproducible.  Denies any palpitations or syncope.  GASTROINTESTINAL:  Denies nausea, vomiting, diarrhea, abdominal pain.  GENITOURINARY:  No dysuria, hematuria.  GYNECOLOGIC AND BREAST:  Denies breast mass or vaginal discharge.  ENDOCRINE:  Denies polyuria, nocturia.  Has chronic hypothyroidism.  HEMATOLOGIC AND LYMPHATIC:  Denies anemia, easy bruising or bleeding.  INTEGUMENTARY:  No acne, rash, lesions.  MUSCULOSKELETAL:  Denies any joint pain in the neck, back or shoulder.  NEUROLOGIC:  Denies any vertigo or ataxia.  PSYCHIATRIC:  Denies any ADD, OCD.   PHYSICAL EXAMINATION: VITAL SIGNS:  Temperature 98.2, pulse 70, respirations 20, blood pressure 170/71, pulse ox 98% on 3 liters.   GENERAL APPEARANCE:  Not under acute distress.  Moderately built and thin-looking female.  HEENT:  Normocephalic, atraumatic.  Pupils are equally reacting to light and accommodation.  No scleral icterus.  No conjunctival injection.  No sinus tenderness.  No postnasal drip.  Moist mucous membranes.  NECK:  Supple.  Positive JVD.  No thyromegaly.  Range of motion is intact.  LUNGS:  Diffuse wheezing is present.  Decreased air entry.  Distant breath sounds.  Positive rales and rhonchi, reproducible diffuse chest tenderness is present.   HEART:  S1, S2.  Regular rate and rhythm.  Positive murmur.  GASTROINTESTINAL:  Soft.  Bowel sounds are positive in all four quadrants.  Nontender, nondistended.  No hepatosplenomegaly.  No masses felt.  NEUROLOGIC:  Awake, alert, oriented x 3.  Motor and sensory are grossly intact.  Reflexes are 2+.  EXTREMITIES:  2+ pitting edema is present.  No cyanosis.  No clubbing.  Dorsalis pedis and posterior tibialis pulses are intact.  SKIN:  Warm to touch.  Normal turgor.  No rashes.  No lesions.  No diaphoresis.   LABORATORY DATA AND IMAGING STUDIES:  CBC, WBC 10.4, hemoglobin 10.7, hematocrit 35.0, platelets are 188.  Cardiac enzymes, CK total 53, CPK-MB 3.7.  Troponin less than 0.02.  Glucose 106, BNP 6843, BUN 27, creatinine 1.64, sodium 133, potassium 4.9, chloride 106, CO2 19, anion gap is 8, GFR 28.  Calcium 8.2.  Chest x-ray with pulmonary edema and cardiomegaly.  A 12-lead EKG has revealed sinus bradycardia with premature atrial complexes at 58 heart rate.  Normal PR interval, normal QRS, positive incomplete right bundle branch block and left anterior fascicular block, nonspecific ST-T wave changes.   ASSESSMENT AND PLAN:  An 81 year old Caucasian female who was just discharged from the hospital yesterday to a nursing home is coming back with chest pain and shortness of breath associated with pedal edema will be admitted with the following assessment and plan.  1.  New  onset congestive heart failure with acute hypoxic respiratory failure.  We will admit her to telemetry.  We will obtain echocardiogram.  We will provide her Lasix IV q. 12 hours.   The patient will be on beta-blocker and statin.  Cardiology consult is placed to on-call cardiology, Dr. Clayborn Bigness.  2.  Chest pain, which is reproducible, associated with chest tightness.  We will cycle cardiac biomarkers.  The first set of troponins are negative.  Acute coronary syndrome protocol will be implemented.  The patient will be getting DuoNebs.  3.  Chest tightness with wheezing, probably cardiac wheeze from new onset congestive heart failure versus reactive airway disease.  The patient will be getting Lasix IV.  We will also give her steroids, nebulizer treatments and continue discharge antibiotic in the IV form.  4.  Hypothyroidism.  Continue Synthroid.  5.  Hypertension.  Resume blood pressure medication except amlodipine which will worsen her pedal edema.  We hold it off at this time.  6.  We will provide her GI and DVT prophylaxis.   The diagnosis and plan of care was discussed in detail with the patient.  She is aware of the plan.    CODE  STATUS:  SHE IS FULL CODE.  Son is the medical power of attorney.   Total time spent on admission is 45 minutes.     ____________________________ Nicholes Mango, MD ag:ea D: 02/21/2013 06:02:11 ET T: 02/21/2013 06:33:03 ET JOB#: [id number]  cc: Nicholes Mango, MD, <Dictator> Dr. Arnetha Gula D. Clayborn Bigness, MD Nicholes Mango MD ELECTRONICALLY SIGNED 03/08/2013 1:41

## 2014-08-29 NOTE — Consult Note (Signed)
PATIENT NAME:  Donna [name], Donna [name] MR#:  [id number] DATE OF BIRTH:  03-Apr-1923  DATE OF CONSULTATION:    REFERRING PHYSICIAN:  Nicholes Mango, MD CONSULTING PHYSICIAN:  Corey Skains, MD  REASON FOR CONSULTATION: Shortness of breath, chest tightness with new-onset congestive heart failure with hypoxia, with hypertension.   CHIEF COMPLAINT: "I'm short of breath."   HISTORY OF PRESENT ILLNESS: This is a 81 year old female from the nursing home with multiple medical problems including polycythemia vera requiring phlebotomy, having a recent discharge to home and/or nursing home from the hospital. At that time, the patient has had significant worsening shortness of breath, weakness and fatigue and hypoxia with a chest x-ray consistent with pulmonary edema. She also had lower extremity edema. With this, the patient has had acute chest discomfort radiating into her back and tightness.   The patient received oxygen and diuretics which have improved this at this time. The patient has had no evidence of EKG changes of myocardial infarction with a normal troponin and CK-MB. The patient also has had nebulizers with improvements. There is no current evidence of documentation of previous echocardiogram or known heart failure. She now is much more comfortable.   REVIEW OF SYSTEMS: The remainder of her review of systems is negative for vision change, ringing in the ears, hearing loss, cough, heartburn, nausea, vomiting, diarrhea, bloody stools, stomach pain, extremity pain, leg weakness, cramping of the buttocks, known blood clots, headaches, blackouts, dizzy spells, nosebleeds, congestion, trouble swallowing, frequent urination, urination at night, muscle weakness, numbness, anxiety, depression, skin lesions, or skin rashes.   PAST MEDICAL HISTORY:  1.  Hypertension.  2.  Hyperlipidemia.  3.  Gastroesophageal reflux.   FAMILY HISTORY: No family members with early onset of cardiovascular disease or hypertension.    SOCIAL HISTORY: Currently denies alcohol or tobacco use.   ALLERGIES: As listed.   MEDICATIONS: As listed.   PHYSICAL EXAMINATION:  VITAL SIGNS: Blood pressure 157/62 bilaterally. Heart rate is 48 upright and 52 reclining and regular.  GENERAL: A well-appearing female in no acute distress.  HEENT: No icterus, thyromegaly, ulcers, hemorrhage, or xanthelasma.  CARDIOVASCULAR: Regular rate and rhythm. Normal S1 and S2 with a 2/6 apical murmur consistent with mitral regurgitation. PMI is diffuse. Carotid upstroke normal without bruit. Jugular venous pressure is normal.  LUNGS: Lungs have decreased breath sounds in the bases with expiratory wheezes and crackles.  ABDOMEN: Soft, nontender, without hepatosplenomegaly or masses. Abdominal aorta is normal size without bruit.  EXTREMITIES: Show 2+ radial, femoral, dorsal pedal pulses with 1+ lower extremity edema. No cyanosis, clubbing or ulcers.  NEUROLOGIC: Oriented to time, place, and person with normal mood and affect.   ASSESSMENT: An 81 year old female with acute systolic dysfunction, congestive heart failure with pulmonary edema, and chronic kidney disease without evidence of myocardial infarction.   RECOMMENDATIONS:  1.  Lasix for lower extremity edema, congestive heart failure.  2.  Echocardiogram to assess systolic versus diastolic dysfunction, congestive heart failure.  3.  Metoprolol and/or ACE inhibitor if > for hypertension control and cardiomyopathy.  4.  Further treatment of possible pulmonary infection.  5.  Further diagnostic testing and treatment options after above.   ____________________________ Corey Skains, MD bjk:np D: 02/21/2013 18:39:49 ET T: 02/21/2013 20:25:15 ET JOB#: [id number]  cc: Corey Skains, MD, <Dictator> Corey Skains MD ELECTRONICALLY SIGNED 03/03/2013 8:53

## 2014-08-29 NOTE — Discharge Summary (Signed)
PATIENT NAME:  Donna [name], LEHN MR#:  [id number] DATE OF BIRTH:  02/21/1923  DATE OF ADMISSION:  02/21/2013 DATE OF DISCHARGE:  02/26/2013  DISCHARGE DIAGNOSES: 1.  Acute respiratory failure due to congestive heart failure.  2.  Acute diastolic heart failure.  3.  Pneumonia, under treatment, from last admission which is cleared.  4.  Chronic kidney disease stage IV with baseline creatinine around 1.7.  5.  Atrioventricular  block status post pacemaker placement.   CONDITION ON DISCHARGE: Stable.   CODE STATUS: FULL CODE.   MEDICATIONS ON DISCHARGE: 1.  Levothyroxine 75 mcg once a day.  2.  Amlodipine 10 mg once a day.  3.  Aspirin 81 mg once a day.  4.  Ranitidine 150 mg once a day.  5.  Calcium and vitamin D 2 times a day.  6.  Albuterol and ipratropium inhalation 3 mL 4 times a day as needed for wheezing.  7.  Haldol 0.5 mg oral tablet 2 times a day.  8.  Metoprolol 25 mg 2 times a day.   DIET ON DISCHARGE: Low sodium.   ACTIVITY LIMITATION: As tolerated.   TIMEFRAME TO FOLLOW: Within 1 to 2 weeks in cardiology clinic with Dr. Saralyn Pilar in [facility] cardiology.   HISTORY AND HOSPITAL COURSE: Please see H and P done on 02/21/2013 by Dr. Nicholes Mango. The patient is an 81 year old Caucasian female with multiple medical problems including polycythemia vera requiring phlebotomy, hypothyroidism. Was recently admitted to hospital with shortness of breath and was diagnosed with sepsis with pneumonia. Was just discharged to [place] home around 6 in the evening and presented back to the ER with chief complaint of chest tightness with shortness of breath and swelling on the legs. Blood pressure was running on the lower side and pulse ox was lower than 80s on presentation. After oxygen supplementation oxygen saturation came up to 94. BNP was elevated at 6800 on admission. Chest x-ray revealed pulmonary edema. Hospitalist service was contacted for further management. The patient had  this new onset congestive heart failure which was diastolic in nature as per echocardiogram due to impaired relaxation. She was continued on oxygen supplementation and given Lasix IV for initial few days, but then her renal function was getting worse so we stopped Lasix and monitored her input and output. Cardiology consult was called in. The patient had some AV block, Mobitz type II, and possibly was the cause for CHF. Cardiologist decided to place a pacemaker, and the patient received pacemaker on February 25, 2013 and was working fine. Her respiratory symptoms improved significantly and she was tolerating room air without any difficulties on the day of discharge so we are discharging without any supplemental oxygen.   OTHER MEDICAL ISSUES:   1.  Pneumonia. The last time the patient was having pneumonia on admission, she was discharged on 15th of October with pneumonia and came back on 16th of October so we continued the antibiotic for 3 more days and stopped on the 19th. The patient remained afebrile and oxygen requirement went down, came to room air, no more shortness of breath, so we are discharging her now without any oxygen supplementation.  2.  Chest pain. The patient had this complaint which was reproducible with chest tightness and this was likely due to CHF.  3.  Hypothyroidism. We continued Synthroid in the hospital.  4.  Hypertension. Blood pressure was stable and we continued monitoring.   CONSULTS IN THE HOSPITAL: Dr. Nehemiah Massed and Dr. Saralyn Pilar for  cardiology in the hospital.  IMPORTANT LABORATORY AND DIAGNOSTIC RESULTS: On presentation, BNP was 6800, went up to 12,900. Creatinine was 1.64 which went up to 1.95 and then gradually came down to 1.77. Troponins were all negative. White cell count went slightly up to 15.2, most likely because of stress in the hospital. Hemoglobin remained stable, between 10.5 to 11.5.   Echocardiogram result as mentioned above with low ejection fraction of 60%,  relaxation abnormality of left ventricle.   TOTAL TIME SPENT ON THIS DISCHARGE: 40 minutes.   ____________________________ Ceasar Lund Anselm Jungling, MD vgv:sb D: 02/26/2013 11:55:35 ET T: 02/26/2013 12:24:55 ET JOB#: [id number]  cc: Ceasar Lund. Anselm Jungling, MD, <Dictator> Isaias Cowman, MD Vaughan Basta MD ELECTRONICALLY SIGNED 03/06/2013 13:58

## 2014-08-30 NOTE — Consult Note (Signed)
Brief Consult Note: Diagnosis: pt with right wirst fracture has h/o htn,hlp, now has hyponatremia.   Patient was seen by consultant.   Consult note dictated.   Recommend further assessment or treatment.   Orders entered.   Discussed with Attending MD.   Comments: 81 yr old female with right wrist (radius and ulna fracture);pt  has hypnatremia with soidum 115, recomend holding surgery till sodium at at 130,hyponatremia can cause seizures/cardiac events / d/w dr,menz 2htn 3.ckd stage 3;f/u with dr.lateef,will consult nephro.  Electronic Signatures: Epifanio Lesches (MD)  (Signed 16-Jan-15 19:19)  Authored: Brief Consult Note   Last Updated: 16-Jan-15 19:19 by Epifanio Lesches (MD)

## 2014-08-30 NOTE — H&P (Signed)
PATIENT NAME:  Donna [name], Donna [name] MR#:  [id number] DATE OF BIRTH:  05/28/1922  DATE OF ADMISSION:  05/24/2013  ADDENDUM    The patient's sodium is 115. I called the patient's pharmacy, [facility] on [address], and they confirmed that the patient started on metolazone 5 mg daily and Lasix 40 mg in the morning, and these medications were started since the last 2 months by Dr. Saralyn Pilar. She is also on salt-free diet, so probably she has hyponatremia due to diuretics, so we are going to hold the diuretics and give her IV fluids with sodium and see how her sodium improves and follow up with nephrology evaluation.   TIME SPENT: Less than 30 minutes on this addendum.  ____________________________ Epifanio Lesches, MD sk:jcm D: 05/24/2013 20:16:34 ET T: 05/24/2013 20:38:29 ET JOB#: [id number]  cc: Epifanio Lesches, MD, <Dictator> Epifanio Lesches MD ELECTRONICALLY SIGNED 06/18/2013 15:36

## 2014-08-30 NOTE — H&P (Signed)
PATIENT NAME:  Donna [name], Donna [name] MR#:  [id number] DATE OF BIRTH:  Mar 01, 1923  DATE OF ADMISSION:  05/24/2013  ADDENDUM     The patient's sodium just came back. Lab reported a sodium of 115. The patient has acute hyponatremia. Sodium in October was 135. At this time, the patient's surgery should be held until the sodium is corrected. We will start normal saline at 60 mL an hour and check met-b every 6 hours, obtain nephrology consult for hyponatremia, and see how she does.   TIME SPENT: As I mentioned before. This is just an addition to history and physical.    ____________________________ Epifanio Lesches, MD sk:jcm D: 05/24/2013 19:09:38 ET T: 05/24/2013 20:05:34 ET JOB#: [id number]  cc: Epifanio Lesches, MD, <Dictator> Epifanio Lesches MD ELECTRONICALLY SIGNED 06/18/2013 15:17

## 2014-08-30 NOTE — Op Note (Signed)
PATIENT NAME:  Donna [name], Donna [name] MR#:  [id number] DATE OF BIRTH:  Oct 15, 1922  DATE OF PROCEDURE:  05/28/2013  PREOPERATIVE DIAGNOSIS:  Hyponatremia.  POSTOPERATIVE DIAGNOSIS:  Hyponatremia.  PROCEDURES:  1. Ultrasound guidance for vascular access to left brachial vein.  2. Fluoroscopic guidance for placement of catheter.  3. Insertion of peripherally inserted central venous catheter, double-lumen PICC line, left arm.  SURGEON: Hortencia Pilar, M.D.   INDICATIONS:   1.  Patient requiring vesicant medications and, therefore, central access.  2. Patient has limited peripheral access, and requires multiple parenteral medications for sustaining her life.   ANESTHESIA: Local.   ESTIMATED BLOOD LOSS: Minimal.   DESCRIPTION OF PROCEDURE: The patient's left arm was sterilely prepped and draped, and a sterile surgical field was created. The brachial vein was accessed under direct ultrasound guidance without difficulty with a micropuncture needle and permanent image was recorded. 0.018 wire was then placed into the superior vena cava. Peel-away sheath was placed over the wire. A single lumen peripherally inserted central venous catheter was then placed over the wire and the wire and peel-away sheath were removed. The catheter tip was placed into the superior vena cava and was secured at the skin at 30 cm with a sterile dressing. The catheter withdrew blood well and flushed easily with heparinized saline. The patient tolerated procedure well.   ____________________________ Katha Cabal, MD ggs:mr D: 05/28/2013 17:36:10 ET T: 05/28/2013 19:37:54 ET JOB#: [id number]  cc: Katha Cabal, MD, <Dictator> Katha Cabal MD ELECTRONICALLY SIGNED 06/19/2013 13:24

## 2014-08-30 NOTE — H&P (Signed)
   Subjective/Chief Complaint right wrist pain   History of Present Illness Fell while walking dog, falling on outstretched right hand.  Denies LOC. Right hand dominant   Past Med/Surgical Hx:  Hypothyroidism:   Hypercholesterolemia:   pacemaker:   hypertension:   Polycythemia Vera:   ALLERGIES:  Sulfa: Unknown  Penicillin: Unknown  HOME MEDICATIONS: Medication Instructions Status  simvastatin 20 mg oral tablet 1 tab(s) orally once a day (at bedtime) Active  haloperidol 0.5 mg oral tablet 1 tab(s) orally 2 times a day Active  albuterol-ipratropium 2.5 mg-0.5 mg/3 mL inhalation solution 3 milliliter(s) inhaled 4 times a day, As Needed - for Wheezing Active  citric acid-sodium citrate 30 milliliter(s) orally 2 times a day with apple juice Active  levothyroxine 75 mcg (0.075 mg) oral tablet 1 tab(s) orally once a day (in the morning) Active  amLODIPine 10 mg oral tablet 1 tab(s) orally once a day at lunch. Active  aspirin 81 mg oral tablet 1 tab(s) orally once a day (in the morning) Active  ranitidine 150 mg oral tablet 1 tab(s) orally once a day (in the morning) Active  calcium-vitamin D 1 tab(s) orally 2 times a day. **630mg /400iu tabs** Active  Metoprolol Tartrate 25 mg oral tablet 1 tab(s) orally 2 times a day Active   Family and Social History:  Family History Non-Contributory   Social History negative tobacco, negative ETOH   Place of Living Home   Review of Systems:  Subjective/Chief Complaint patient has some confusion and is poor historian   Medications/Allergies Reviewed Medications/Allergies reviewed   Physical Exam:  GEN well developed, well nourished, in mild distress from wrist pain   NECK supple   RESP normal resp effort   CARD regular rate   ABD denies tenderness   SKIN normal to palpation   NEURO decreased sensation right thumb, indes   Lab Results: Routine Hem:  16-Jan-15 18:28   WBC (CBC)  11.2  RBC (CBC)  5.32  Hemoglobin (CBC)  11.4   Hematocrit (CBC) 37.2  Platelet Count (CBC) 179 (Result(s) reported on 24 May 2013 at 06:40PM.)  MCV  70  MCH  21.5  MCHC  30.8  RDW  23.3    Assessment/Admission Diagnosis completely displaced distal radius fracture with median nerve dysfunction   Plan ORIF tomorrow with median nerve release hope to discharge to son's home post op risks, benefits, alternatives discussed Hospitalist consult secondary to medical issues   Electronic Signatures: Laurene Footman (MD)  (Signed 16-Jan-15 18:56)  Authored: CHIEF COMPLAINT and HISTORY, PAST MEDICAL/SURGIAL HISTORY, ALLERGIES, HOME MEDICATIONS, FAMILY AND SOCIAL HISTORY, REVIEW OF SYSTEMS, PHYSICAL EXAM, LABS, ASSESSMENT AND PLAN   Last Updated: 16-Jan-15 18:56 by Laurene Footman (MD)

## 2014-08-30 NOTE — Consult Note (Signed)
PATIENT NAME:  Donna [name], Donna [name] MR#:  [id number] DATE OF BIRTH:  May 20, 1922  DATE OF CONSULTATION:  05/24/2013  REFERRING PHYSICIAN:  Dr. Rudene Christians. CONSULTING PHYSICIAN:  Epifanio Lesches, MD  PRIMARY CARE PHYSICIAN: Dr. Deborra Medina.  CARDIOLOGIST: Dr. Saralyn Pilar.   EMERGENCY ROOM PHYSICIAN: Dr. Lenise Arena.   REASON FOR CONSULT:  Preop clearance.  HISTORY OF PRESENT ILLNESS: An 81 year old female patient was walking the dog this morning, fell on the slope and then fell on her right hand, suffered a right wrist fracture, so she is going to have surgery tomorrow. ER asked [place] to see her for medical problems of hypertension, hypothyroidism and hyperlipidemia. for preop clearance. The patient denies any chest pain. No trouble breathing. Lives alone by herself and is active and independent of activities of daily living. Did not feel dizziness. Did not have trouble breathing today. The patient had a pacemaker placed 2 months ago by Dr. Saralyn Pilar.  PAST MEDICAL HISTORY: Significant for hypertension, hyperlipidemia, history of polycythemia vera controlled by phlebotomies, and also history of hypothyroidism, osteoporosis and history of peripheral neuropathy. She has a history of CKD, followed up with Dr. Holley Raring.   MEDICATIONS:  Amlodipine 10 mg p.o. daily, aspirin 81 mg daily,  Haldol 0.5 mg p.o. b.i.d., levothyroxine 75 mcg p.o. in the morning, metoprolol  25 mg p.o. b.i.d., ranitidine 150 mg p.o. daily, and the patient is also on simvastatin 20 mg p.o. daily.   SOCIAL HISTORY: No smoking. No drinking.   PAST SURGICAL HISTORY: Significant for history of pacemaker and also hysterectomy.   ALLERGIES:  SULFA AND PENICILLINS.   REVIEW OF SYSTEMS:  CONSTITUTIONAL: Has no fever. No fatigue. No weakness.  EYES: No blurred vision. No inflammation.  ENT: No tinnitus. No ear pain. No epistaxis. No difficulty swallowing.  RESPIRATORY: The patient has no cough. No wheezing.  CARDIOVASCULAR: No chest pain. No  orthopnea. No PND. No pedal edema.  GASTROINTESTINAL:  No vomiting.  No nausea. No abdominal pain.  GENITOURINARY: No dysuria.  ENDOCRINE: No polyuria or nocturia.  HEMATOLOGIC: No anemia. The patient has a history of polycythemia vera.  INTEGUMENTARY: No skin rashes.  MUSCULOSKELETAL: Has right wrist pain and swelling of the right wrist and deformity of the right wrist noted.  NEUROLOGIC: No numbness or weakness.  PSYCHIATRIC: No anxiety or insomnia.   PHYSICAL EXAMINATION: VITAL SIGNS: Temperature 97.7, heart rate 62, blood pressure is 138/63, sats 96% on room air. GENERAL:  She is alert, awake, oriented, well-developed, well-nourished female, not in distress. HEENT: Head: Normocephalic, atraumatic. Eyes: Pupils equally reacting to light. Extraocular movements are intact. Nose: No nasal lesions. No drainage. Ears: No tympanic membrane congestion. Mouth: No lesions noted.  NECK: Supple. No JVD. No masses. Normal range of motion.  RESPIRATORY: Good respiratory effort. Clear to auscultation. No wheeze noted.  CARDIOVASCULAR: S1, S2, regular. No murmurs. PMI is not displaced. Good femoral pulses and pedal pulses.  GASTROINTESTINAL: The patient had no tenderness. No mass. No organomegaly. No hernias. Bowel sounds equal x 4 quadrants. Abdomen is nondistended.  MUSCULOSKELETAL: Normal gait and station.  The patient does have a deformity of the right hand and swelling and also externally rotated right wrist.  EXTREMITIES: Move x 4. The patient's range of motion is adequate.  SKIN: Inspection within normal limits.  LYMPHATICS: No lymphadenopathy.  NEUROLOGICAL: The patient has no focal neurological deficit. Cranial nerves II through XII are intact. Power 5/5 in upper and lower extremities. Sensations are intact.  DTRs are 2+ bilaterally.  LABORATORY DATA:  EKG showed an electronic ventricular pacemaker at 60 beats per minute.   WBC 11.3, hemoglobin 11.4, hematocrit 37.2, platelets 179. Chest x-ray  shows some mild hyperinflation and COPD.  Interstitial markings are prominent suggesting mild edema. The patient has cardiac silhouette enlarged.   Wrist x-ray on the right side shows sustained acute displaced overriding fracture of metaphysis of distal radius and ulna.   BMP is pending.   ASSESSMENT AND PLAN: This patient is an 81 year old female patient with a history of hypertension, hypothyroidism, chronic kidney disease, chronic kidney disease  stage III, comes in with right wrist fracture. The patient is at moderate risk for planned surgery due to her age, hypertension and CKD, hyperthyroidism. The patient can certainly have procedure, but recommend postop incentive spirometry and postop pain medications and antibiotics. The patient will get IV fluids to help with hydration and prevent overt renal failure. Regarding her hypertension, the patient can continue amlodipine 10 mg daily and also metoprolol 25 mg p.o. daily.   For her gastroesophageal reflux disease, we can continue IV PPIs. For Hyperlipidemia, continue simvastatin 20 mg p.o. daily. For hypothyroidism, continue 75 mcg of Synthroid daily.  During the  perioperative period, if the BP is elevated, again, use the metoprolol 2.5 mg IV q.6 hours p.r.n. for systolic blood pressure more than 160/90. Monitor kidney function tomorrow. The patient has a history of polycythemia vera, but hemoglobin is 11.4 at this time, for which she does not need any phlebotomy.   Slightly elevated white count, probably due to stress. No evidence of pneumonia. The patient has questionable CHF on x-ray, but clinically she is not in CHF, and her volume status is normal.   TIME SPENT: About 55 minutes on the consult.   ____________________________ Epifanio Lesches, MD sk:dmm D: 05/24/2013 19:05:00 ET T: 05/24/2013 19:51:19 ET JOB#: [id number]  cc: Epifanio Lesches, MD, <Dictator> Epifanio Lesches MD ELECTRONICALLY SIGNED 06/18/2013 15:36

## 2014-08-30 NOTE — Discharge Summary (Signed)
PATIENT NAME:  Donna [name], Donna [name] MR#:  [id number] DATE OF BIRTH:  01/24/1923  DATE OF ADMISSION:  05/25/2013 DATE OF DISCHARGE:  05/31/2013  ADMITTING DIAGNOSIS: Right distal radius fracture.   DISCHARGE DIAGNOSIS: Right distal radius fracture.  OPERATION: On 05/29/2013, she underwent an ORIF of the right distal radius with carpal tunnel release.   SURGEON: Dr. Hessie Knows.   ANESTHESIA: Bier block.  TOURNIQUET TIME: 59 minutes at 300 mmHg.  IMPLANTS: [employer] short, narrow DVR plate with screws.   COMPLICATIONS: None.   HISTORY: Ms. Celisse is an 81 year old white female, who fell while walking her dog and had a fall on the outstretched hand. She suffered a right distal radius fracture. She was admitted due to medical problems including hypertension, hypothyroidism and hyperlipidemia. She was up also hyponatremic. After medical clearance from her hyponatremia, she underwent an ORIF of her right distal radius with carpal tunnel release as she was having median nerve symptoms.   PHYSICAL EXAMINATION:  GENERAL: Well-developed, well-nourished in mild distress from his pain.  NECK: Supple.  RESPIRATORY: Normal respiratory effort.  CARDIOVASCULAR: Regular rate.  ABDOMEN: Denies tenderness.  SKIN: Normal to palpation.  NEUROLOGIC: Decreased sensation right thumb.  MUSCULOSKELETAL: Deformity of the right hand and swelling. Her wrist is externally rotated.   HOSPITAL COURSE: After initial admission on 05/24/2013, medicine began following. Orthopedics was consulted. Her initial sodium was 115. On 05/25/2013, sodium was 116. The surgery was put on hold. On 01/18,  her sodium was 123. On 05/27/2013, sodium was 125. On 05/28/2013, sodium was 123 and she was cleared for surgery. She had surgery on 05/28/2013. On postoperative day 1, on 05/29/2013, sodium was 128. She was placed in a volar splint after surgery. On postoperative day 2,  05/30/2013, sodium was 130 and nephrology was consulted. On  postoperative day 3, 05/31/2013, sodium was 130 and medicine cleared her for discharge.   CONDITION AT DISCHARGE: Stable.   DISPOSITION: The patient was sent to rehab.   DISCHARGE INSTRUCTIONS: The patient will follow up with [facility] orthopedics in 3 to 5 days for skin check. She will do physical therapy and nonweightbearing on the right upper extremity. TED hose, knee-high bilaterally. Her dressing is not to be changed and her splint is to be kept on. She will follow up with nephrology in 2 weeks to follow up on her hyponatremia.   DISCHARGE MEDICATIONS: Please see discharge instructions for complete list of discharge medications.  ____________________________ Nicolet Griffy M. Tretha Sciara, NP amb:aw D: 05/31/2013 09:06:24 ET T: 05/31/2013 09:19:37 ET JOB#: [id number]  cc: Rachard Isidro M. Tretha Sciara, NP, <Dictator> Kem Kays Tiny Chaudhary FNP ELECTRONICALLY SIGNED 06/03/2013 8:17

## 2014-08-30 NOTE — Op Note (Signed)
PATIENT NAME:  Donna [name], POTENZA MR#:  [id number] DATE OF BIRTH:  May 10, 1922  DATE OF PROCEDURE:  05/29/2013  PREOPERATIVE DIAGNOSES: Right distal radius fracture, displaced, and carpal tunnel syndrome.   POSTOPERATIVE DIAGNOSIS: Right distal radius fracture, displaced, and carpal tunnel syndrome.   PROCEDURES: Open reduction and internal fixation, right distal radius, and carpal tunnel release.   ANESTHESIA: Bier block.   SURGEON: Laurene Footman, M.D.   DESCRIPTION OF PROCEDURE: The patient was brought to the operating room, and a timeout procedure was completed prior to application of the Bier block. After the Bier block was placed with a tourniquet at 300 mmHg, the arm was prepped and draped in the usual sterile fashion and a timeout procedure again carried out. The index and middle fingers were placed in fingertrap traction with 10 pounds of traction to help aid in getting a partial reduction. The carpal tunnel release was carried out at this point with an approximately 2.5 cm incision in line with the ring metacarpal along a palmar crease. The incision was carried down through the skin and subcutaneous tissue. The palmar fascia was incised and the transverse carpal ligament identified. This was opened, and a small vascular hemostat was placed underneath the transverse carpal ligament to protect the underlying structures. Release was carried out distally and then proximally, and there appeared to be good decompression of the nerve. The distal radius was then approached with a volar approach centered over the FCR tendon. The incision was carried down through the skin. The FCR tendon identified, tendon sheath incised and the tendon retracted radially. Deep fascia was then incised, and the pronator was elevated off the distal radius, although most of it had been elevated off secondary to fracture displacement. The fracture was comminuted. There was significant shortening and radial deviation. The  brachioradialis was elevated off the distal fragment to aid in reduction and prevent it being a deforming force pulling the distal radius radially. After acceptable alignment could be obtained, a volar short narrow DVR plate was applied in the appropriate position, 3 screws placed in the cortex, three 10 mm screws inserted. Next, the wrist was placed in slight radial deviation and volar flexion, and C-arm showed near anatomic position with restoration of length and radial tilt and neutral volar tilt. The screw holes were filled using standard technique, drilling, measuring and placing the smooth pegs with 1 variable angle screw being utilized. The wound was then thoroughly irrigated after checking to make sure there was no penetration into the joint. With thorough examination of with wrist with traction relieved with gentle range of motion, there was excellent stability. Both wounds were then irrigated and the wounds closed with 3-0 Vicryl subcutaneously in the distal forearm incision and just 4-0 skin nylon for the palmar incision for the carpal tunnel and for the skin incision from the fracture. Prior to dressing application, 10 mL of 7.0% Sensorcaine without epinephrine was infiltrated into  the area of the carpal tunnel incision to aid in postop analgesia. Sterile dressing of Xeroform, 4 x 4's, Webril, volar splint and Ace wrap was applied. Total tourniquet time was 59 minutes for the operation as well as to allow for the Bier block to set up. There were no complications. EBL was minimal.   IMPLANT: [employer] DVR short narrow plate with multiple screws and pegs.   ____________________________ Laurene Footman, MD mjm:gb D: 05/29/2013 17:53:03 ET T: 05/29/2013 22:18:46 ET JOB#: [id number]  cc: Laurene Footman, MD, <Dictator> Reiss Mowrey J  [facility] MD ELECTRONICALLY SIGNED 05/30/2013 7:24

## 2014-11-22 IMAGING — CR DG CHEST 2V
1 series · 2 of 2 positions shown · non-contrast
Comparison: Portable chest x-ray February 25, 2013.

CLINICAL DATA: Preoperative study

EXAM:
CHEST  2 VIEW

[Series 2: x chest ap · 0.14mm/px · 2 of 2 slices shown]
[im 1/2]
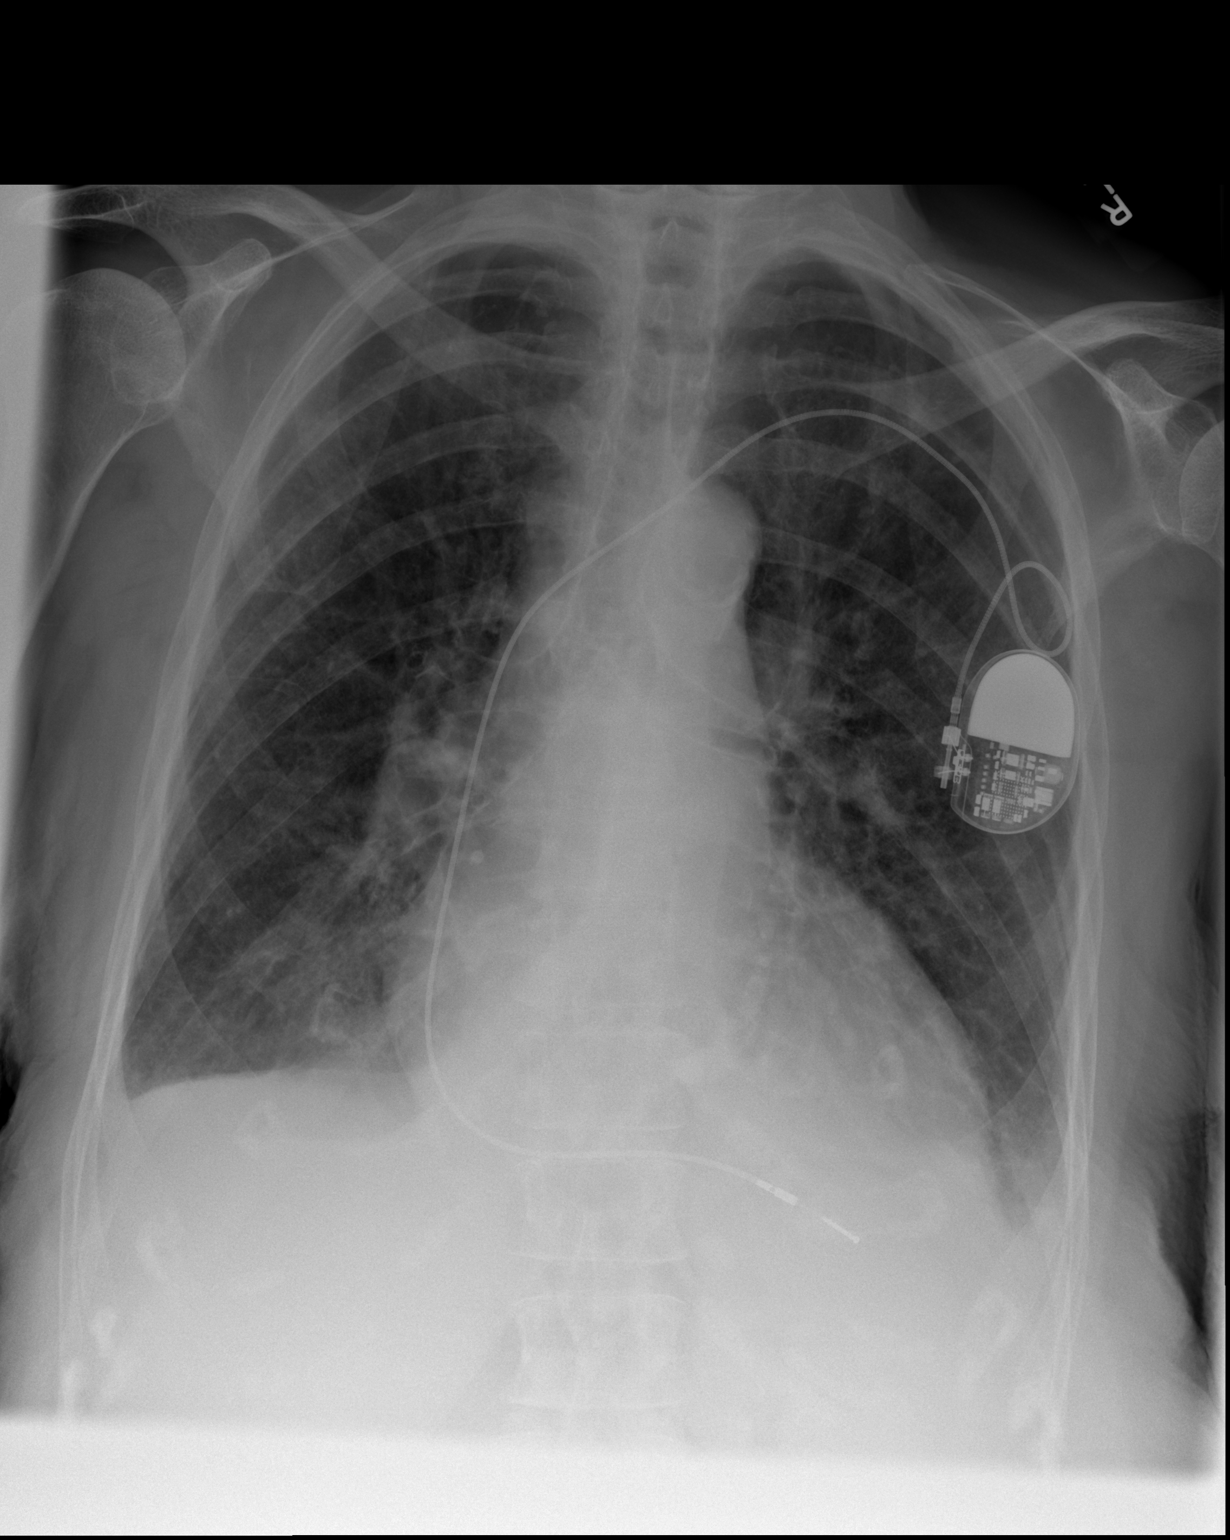
[im 2/2]
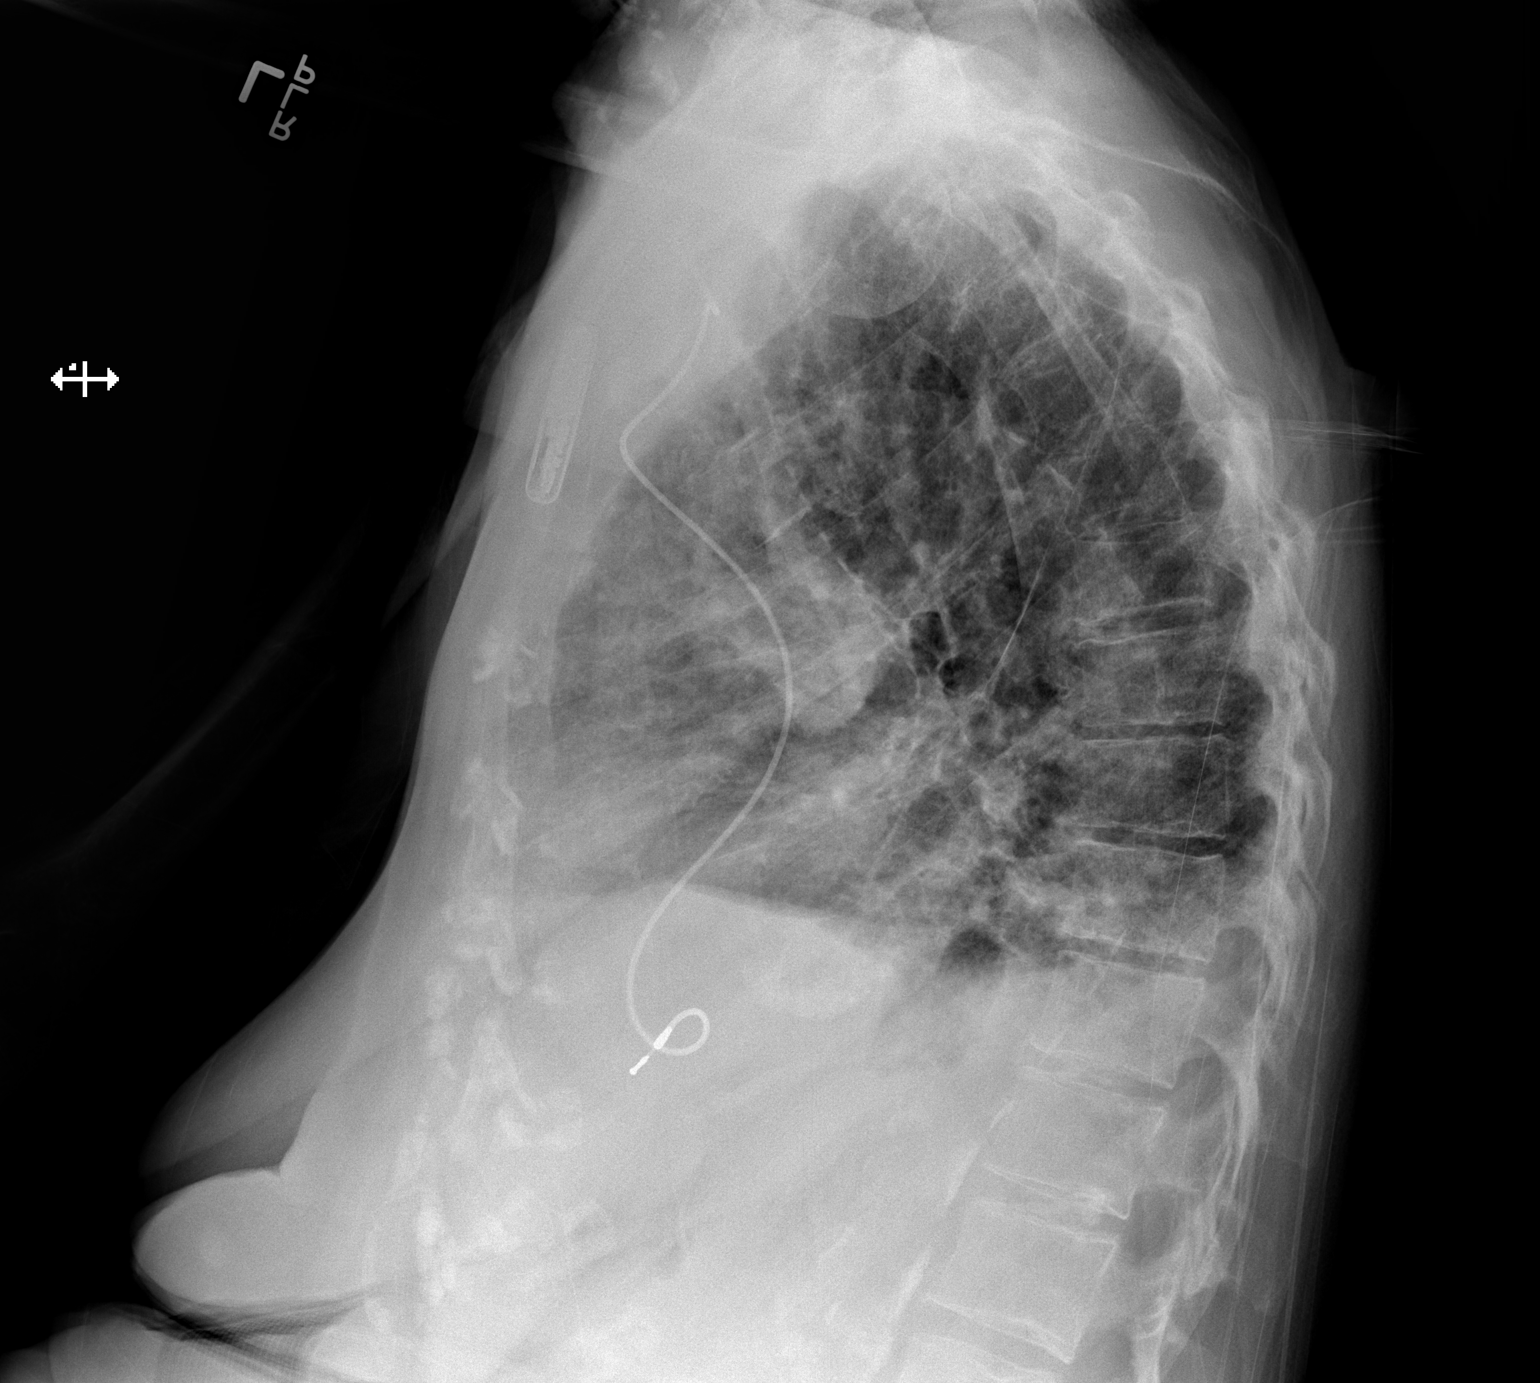

[2 of 2 positions shown; findings below may reference images not displayed]

FINDINGS: The lungs are well-expanded. There are small bilateral pleural
effusions layering posteriorly and laterally not greatly changed
from the previous study. The pulmonary interstitial markings are
mildly prominent. The cardiac silhouette is mildly enlarged. The
pulmonary vascularity is not engorged. The permanent pacemaker is
unchanged in appearance and position. The trachea is midline. The
observed portions of the bony thorax appear normal.
IMPRESSION: 1. There is mild hyperinflation consistent with COPD. The
interstitial markings are mildly prominent suggesting mild
interstitial edema.
2. The cardiopericardial silhouette is mildly enlarged but the
pulmonary vascularity is not clearly engorged.
3. There small bilateral pleural effusions.

## 2014-11-26 IMAGING — US US RENAL KIDNEY
1 series · 14 of 25 positions shown · non-contrast
Comparison: None.

CLINICAL DATA: Acute renal failure.  Chronic renal disease.

EXAM:
RENAL/URINARY TRACT ULTRASOUND COMPLETE

[Series 1: us renal kidney · 0.24mm/px · 14 of 26 slices shown]
[im 1/26]
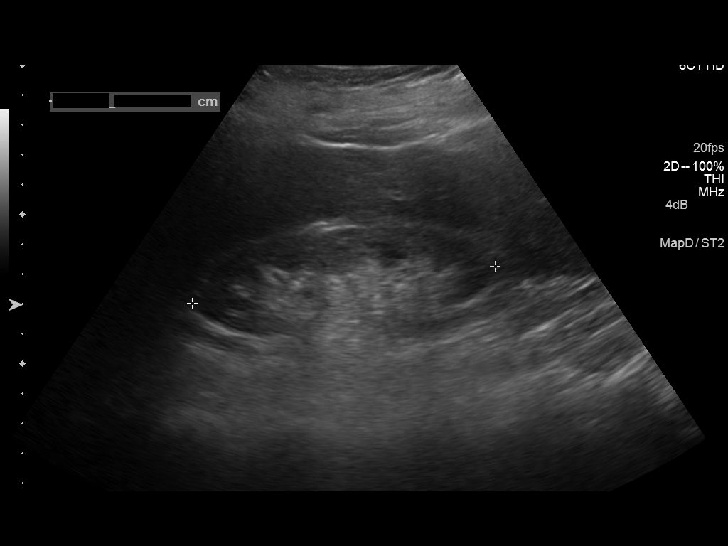
[im 3/26]
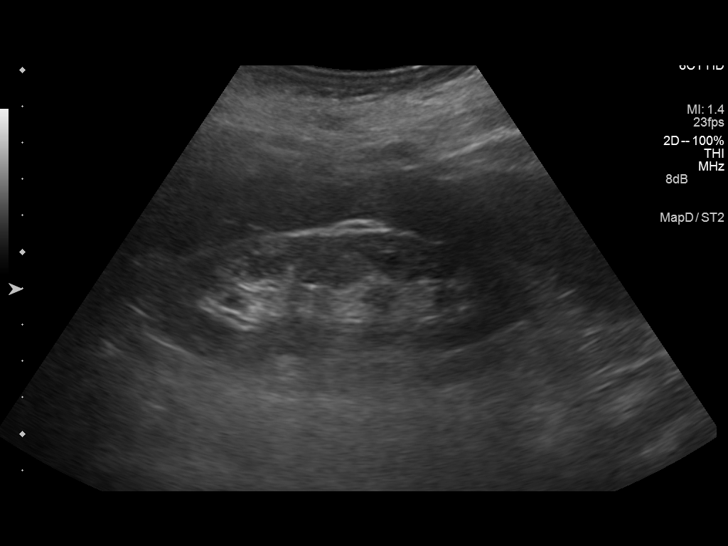
[im 5/26]
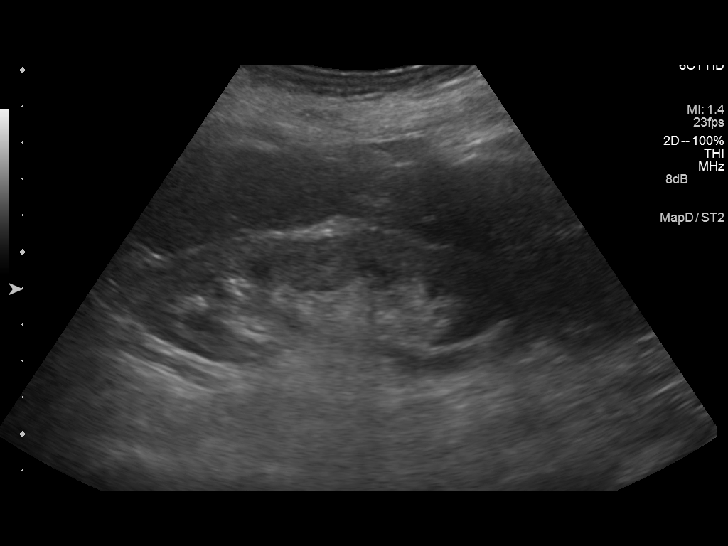
[im 7/26]
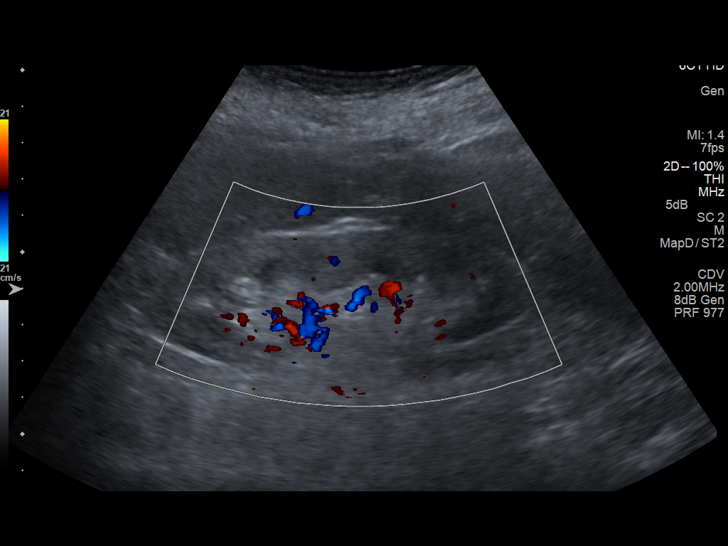
[im 9/26]
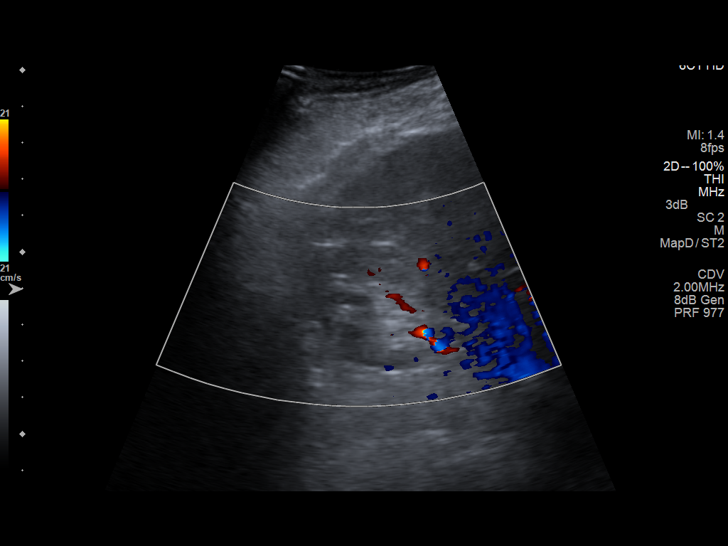
[im 10/26]
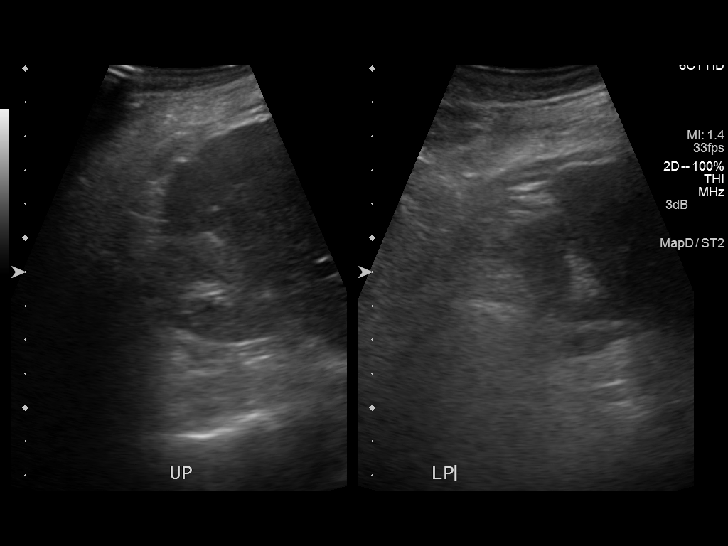
[im 12/26]
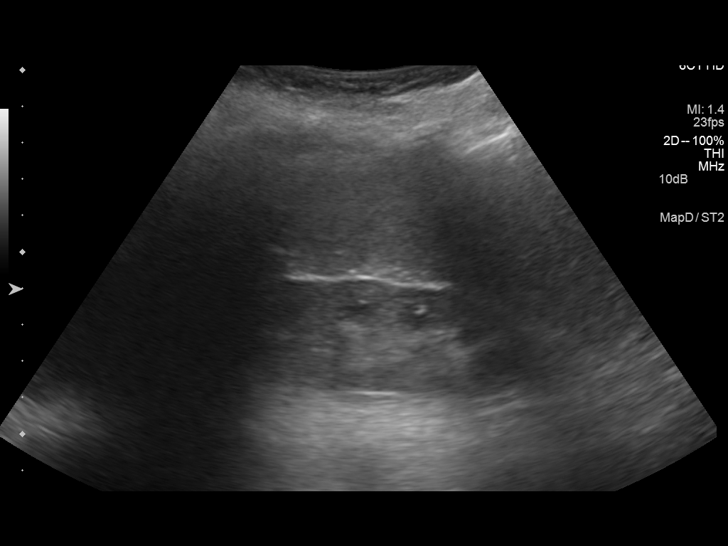
[im 14/26]
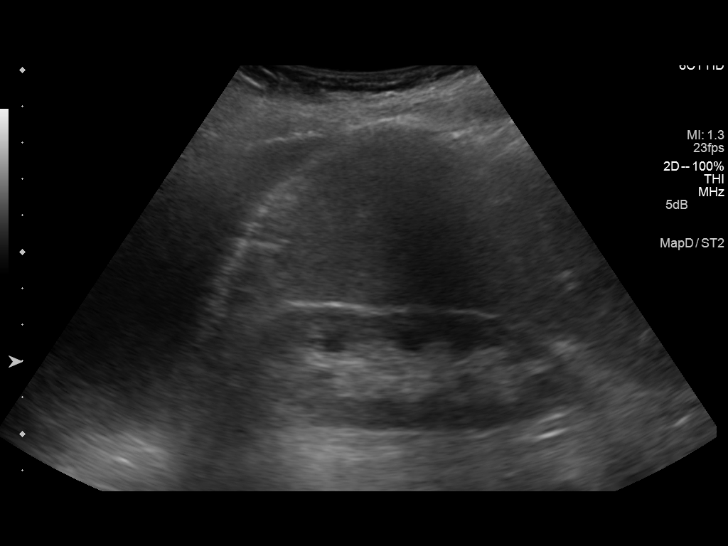
[im 16/26]
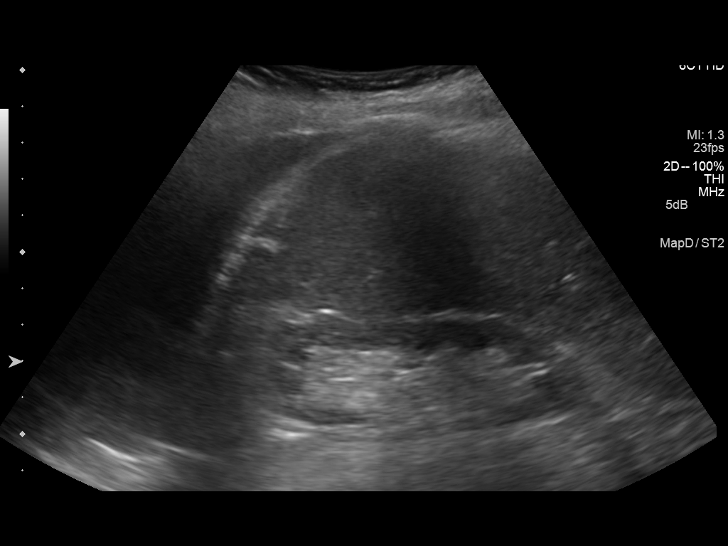
[im 17/26]
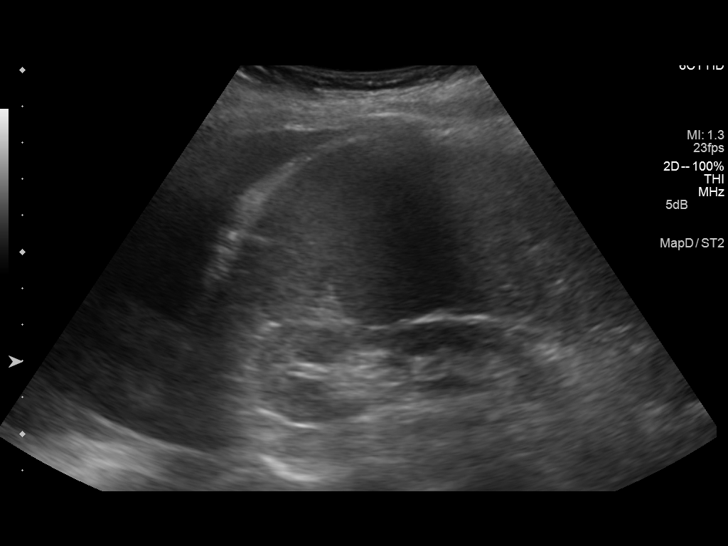
[im 19/26]
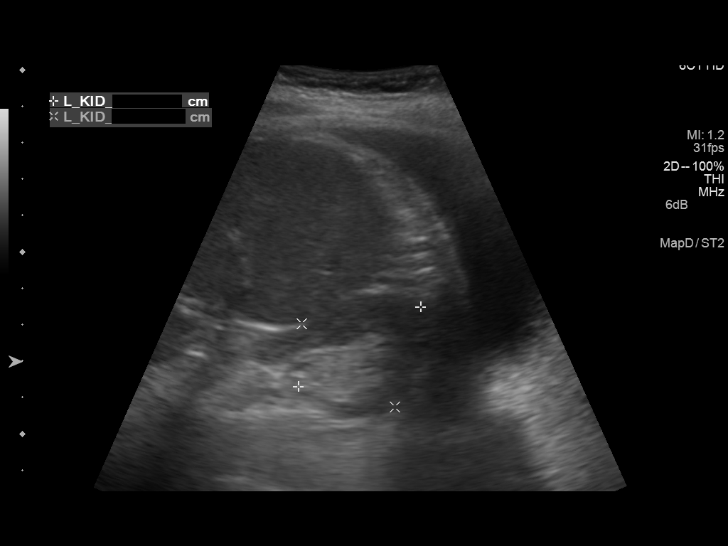
[im 21/26]
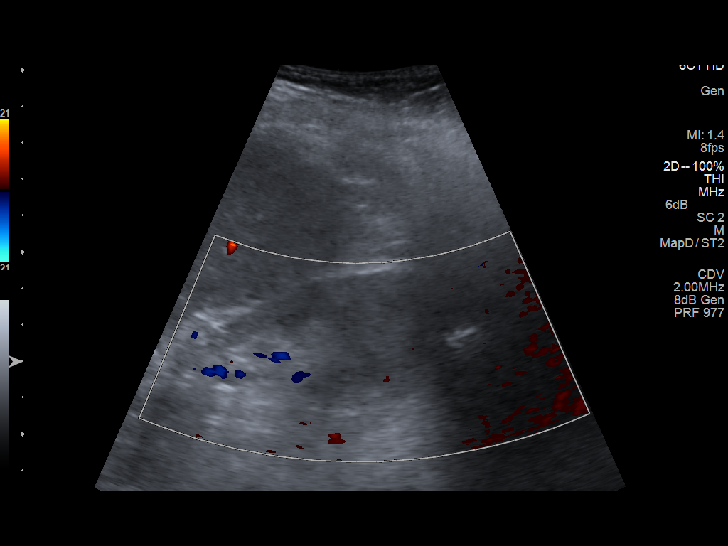
[im 23/26]
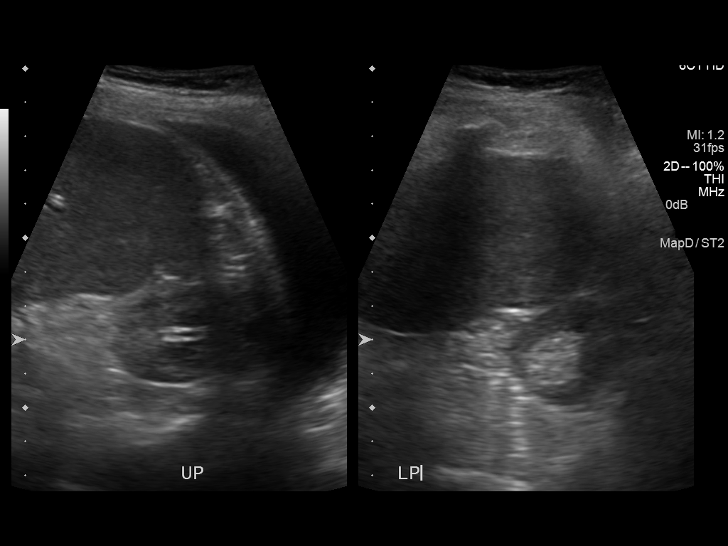
[im 26/26]
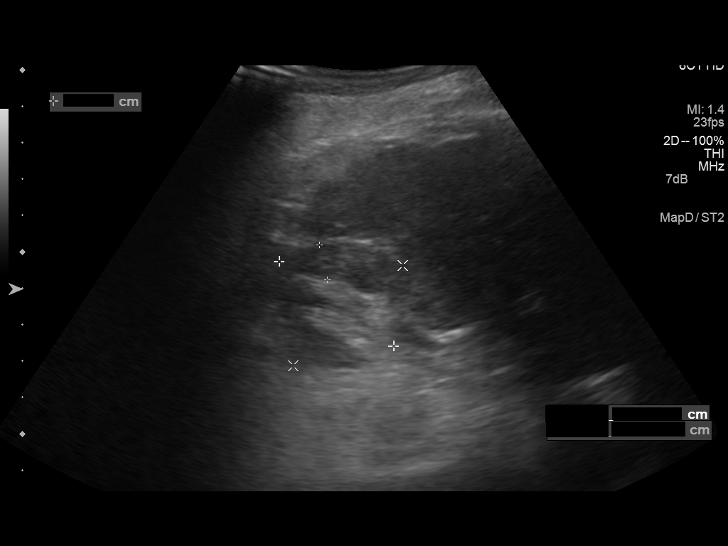

[14 of 25 positions shown; findings below may reference images not displayed]

FINDINGS: Right Kidney:

Length: 10.2 cm. There is increased cortical echogenicity. No stone,
mass or hydronephrosis.

Left Kidney:

Length: 9.8 cm.. There is increased cortical echogenicity. No stone,
mass or hydronephrosis.

Bladder:

Appears normal for degree of bladder distention.
IMPRESSION: Negative for hydronephrosis or other acute abnormality. Findings
compatible with medical renal disease.

## 2014-12-14 ENCOUNTER — Other Ambulatory Visit: Payer: Self-pay | Admitting: Family Medicine

## 2015-01-11 ENCOUNTER — Other Ambulatory Visit: Payer: Self-pay | Admitting: Family Medicine

## 2015-02-08 ENCOUNTER — Other Ambulatory Visit: Payer: Self-pay | Admitting: Family Medicine

## 2015-02-09 NOTE — Telephone Encounter (Signed)
Pt has not been seen since 12/2013

## 2015-05-07 ENCOUNTER — Encounter: Payer: Self-pay | Admitting: *Deleted

## 2016-02-07 DEATH — deceased
